# Patient Record
Sex: Female | Born: 1950 | Race: White | Hispanic: No | Marital: Married | Smoking: Never smoker
Health system: Southern US, Community
[De-identification: ages and names within clinical notes are randomized; demographics above are authoritative.]

## PROBLEM LIST (undated history)

## (undated) DIAGNOSIS — N838 Other noninflammatory disorders of ovary, fallopian tube and broad ligament: Secondary | ICD-10-CM

## (undated) DIAGNOSIS — E079 Disorder of thyroid, unspecified: Secondary | ICD-10-CM

## (undated) DIAGNOSIS — R251 Tremor, unspecified: Secondary | ICD-10-CM

## (undated) DIAGNOSIS — I10 Essential (primary) hypertension: Secondary | ICD-10-CM

## (undated) DIAGNOSIS — D219 Benign neoplasm of connective and other soft tissue, unspecified: Secondary | ICD-10-CM

## (undated) HISTORY — PX: TONSILLECTOMY AND ADENOIDECTOMY: SUR1326

## (undated) HISTORY — DX: Benign neoplasm of connective and other soft tissue, unspecified: D21.9

## (undated) HISTORY — DX: Essential (primary) hypertension: I10

## (undated) HISTORY — DX: Tremor, unspecified: R25.1

## (undated) HISTORY — DX: Other noninflammatory disorders of ovary, fallopian tube and broad ligament: N83.8

## (undated) HISTORY — DX: Disorder of thyroid, unspecified: E07.9

---

## 1983-02-06 HISTORY — PX: TUBAL LIGATION: SHX77

## 1993-02-05 DIAGNOSIS — D219 Benign neoplasm of connective and other soft tissue, unspecified: Secondary | ICD-10-CM

## 1993-02-05 HISTORY — DX: Benign neoplasm of connective and other soft tissue, unspecified: D21.9

## 1993-03-08 DIAGNOSIS — E079 Disorder of thyroid, unspecified: Secondary | ICD-10-CM

## 1993-03-08 HISTORY — DX: Disorder of thyroid, unspecified: E07.9

## 1994-05-07 DIAGNOSIS — I10 Essential (primary) hypertension: Secondary | ICD-10-CM

## 1994-05-07 HISTORY — DX: Essential (primary) hypertension: I10

## 1996-07-06 HISTORY — PX: ABDOMINAL HYSTERECTOMY: SHX81

## 1998-04-06 DIAGNOSIS — N838 Other noninflammatory disorders of ovary, fallopian tube and broad ligament: Secondary | ICD-10-CM

## 1998-04-06 HISTORY — DX: Other noninflammatory disorders of ovary, fallopian tube and broad ligament: N83.8

## 1998-05-07 HISTORY — PX: BILATERAL SALPINGOOPHORECTOMY: SHX1223

## 1998-05-10 ENCOUNTER — Inpatient Hospital Stay (HOSPITAL_COMMUNITY): Admission: RE | Admit: 1998-05-10 | Discharge: 1998-05-12 | Payer: Self-pay | Admitting: Obstetrics and Gynecology

## 1999-04-28 ENCOUNTER — Other Ambulatory Visit: Admission: RE | Admit: 1999-04-28 | Discharge: 1999-04-28 | Payer: Self-pay | Admitting: Obstetrics and Gynecology

## 1999-04-28 LAB — HM PAP SMEAR: HM Pap smear: NEGATIVE

## 2000-07-09 ENCOUNTER — Encounter: Payer: Self-pay | Admitting: Obstetrics and Gynecology

## 2000-07-09 ENCOUNTER — Ambulatory Visit (HOSPITAL_COMMUNITY): Admission: RE | Admit: 2000-07-09 | Discharge: 2000-07-09 | Payer: Self-pay | Admitting: Obstetrics and Gynecology

## 2001-09-02 ENCOUNTER — Encounter: Payer: Self-pay | Admitting: Obstetrics and Gynecology

## 2001-09-02 ENCOUNTER — Ambulatory Visit (HOSPITAL_COMMUNITY): Admission: RE | Admit: 2001-09-02 | Discharge: 2001-09-02 | Payer: Self-pay | Admitting: Obstetrics and Gynecology

## 2003-05-11 ENCOUNTER — Ambulatory Visit (HOSPITAL_COMMUNITY): Admission: RE | Admit: 2003-05-11 | Discharge: 2003-05-11 | Payer: Self-pay | Admitting: Obstetrics and Gynecology

## 2004-12-26 ENCOUNTER — Ambulatory Visit (HOSPITAL_COMMUNITY): Admission: RE | Admit: 2004-12-26 | Discharge: 2004-12-26 | Payer: Self-pay | Admitting: Obstetrics and Gynecology

## 2006-01-02 ENCOUNTER — Ambulatory Visit (HOSPITAL_COMMUNITY): Admission: RE | Admit: 2006-01-02 | Discharge: 2006-01-02 | Payer: Self-pay | Admitting: Obstetrics & Gynecology

## 2006-08-20 ENCOUNTER — Ambulatory Visit (HOSPITAL_COMMUNITY): Admission: RE | Admit: 2006-08-20 | Discharge: 2006-08-20 | Payer: Self-pay | Admitting: Obstetrics and Gynecology

## 2006-11-06 HISTORY — PX: COLONOSCOPY: SHX174

## 2007-01-08 ENCOUNTER — Ambulatory Visit (HOSPITAL_COMMUNITY): Admission: RE | Admit: 2007-01-08 | Discharge: 2007-01-08 | Payer: Self-pay | Admitting: Obstetrics & Gynecology

## 2009-02-05 LAB — HM COLONOSCOPY

## 2010-02-26 ENCOUNTER — Encounter: Payer: Self-pay | Admitting: Obstetrics and Gynecology

## 2010-02-26 ENCOUNTER — Encounter: Payer: Self-pay | Admitting: Obstetrics & Gynecology

## 2010-05-04 ENCOUNTER — Other Ambulatory Visit: Payer: Self-pay | Admitting: Obstetrics and Gynecology

## 2010-05-04 DIAGNOSIS — Z1231 Encounter for screening mammogram for malignant neoplasm of breast: Secondary | ICD-10-CM

## 2010-05-04 DIAGNOSIS — Z780 Asymptomatic menopausal state: Secondary | ICD-10-CM

## 2010-05-11 ENCOUNTER — Ambulatory Visit (HOSPITAL_COMMUNITY): Payer: BC Managed Care – PPO

## 2010-05-11 ENCOUNTER — Ambulatory Visit (HOSPITAL_COMMUNITY): Payer: Self-pay

## 2010-10-27 ENCOUNTER — Ambulatory Visit (HOSPITAL_COMMUNITY)
Admission: RE | Admit: 2010-10-27 | Discharge: 2010-10-27 | Disposition: A | Discharge status: Home / Self Care | Payer: BC Managed Care – PPO | Source: Ambulatory Visit | Attending: Obstetrics and Gynecology | Admitting: Obstetrics and Gynecology

## 2010-10-27 DIAGNOSIS — Z1231 Encounter for screening mammogram for malignant neoplasm of breast: Secondary | ICD-10-CM | POA: Insufficient documentation

## 2010-10-27 DIAGNOSIS — Z780 Asymptomatic menopausal state: Secondary | ICD-10-CM

## 2011-11-07 ENCOUNTER — Other Ambulatory Visit: Payer: Self-pay | Admitting: Nurse Practitioner

## 2011-11-07 DIAGNOSIS — Z1231 Encounter for screening mammogram for malignant neoplasm of breast: Secondary | ICD-10-CM

## 2011-11-27 ENCOUNTER — Ambulatory Visit (HOSPITAL_COMMUNITY)
Admission: RE | Admit: 2011-11-27 | Discharge: 2011-11-27 | Disposition: A | Discharge status: Home / Self Care | Payer: BC Managed Care – PPO | Source: Ambulatory Visit | Attending: Nurse Practitioner | Admitting: Nurse Practitioner

## 2011-11-27 DIAGNOSIS — Z1231 Encounter for screening mammogram for malignant neoplasm of breast: Secondary | ICD-10-CM

## 2011-11-27 LAB — HM MAMMOGRAPHY: HM MAMMO: NEGATIVE

## 2012-08-19 ENCOUNTER — Other Ambulatory Visit: Payer: Self-pay | Admitting: Nurse Practitioner

## 2012-08-19 NOTE — Telephone Encounter (Signed)
eScribe request for refill on ESTRADIOL  Last filled - 08/13/11 X 1 YEAR Last AEX - 08/13/11 Next AEX - not scheduled. PC to pt.  AEX scheduled for 08/26/12.  She has 11 days worth of medication left.  She will wait for appt to have med refilled.  RX denied with pharmacy.

## 2012-08-21 ENCOUNTER — Encounter: Payer: Self-pay | Admitting: *Deleted

## 2012-08-26 ENCOUNTER — Ambulatory Visit (INDEPENDENT_AMBULATORY_CARE_PROVIDER_SITE_OTHER): Payer: 59 | Admitting: Nurse Practitioner

## 2012-08-26 ENCOUNTER — Encounter: Payer: Self-pay | Admitting: Nurse Practitioner

## 2012-08-26 VITALS — BP 124/70 | HR 76 | Resp 12 | Ht 64.5 in | Wt 151.2 lb

## 2012-08-26 DIAGNOSIS — Z0000 Encounter for general adult medical examination without abnormal findings: Secondary | ICD-10-CM

## 2012-08-26 DIAGNOSIS — E039 Hypothyroidism, unspecified: Secondary | ICD-10-CM

## 2012-08-26 DIAGNOSIS — Z01419 Encounter for gynecological examination (general) (routine) without abnormal findings: Secondary | ICD-10-CM

## 2012-08-26 DIAGNOSIS — E559 Vitamin D deficiency, unspecified: Secondary | ICD-10-CM

## 2012-08-26 LAB — POCT URINALYSIS DIPSTICK
Leukocytes, UA: NEGATIVE
Spec Grav, UA: 1.02
pH, UA: 5.5

## 2012-08-26 MED ORDER — LEVOTHYROXINE SODIUM 25 MCG PO TABS: 25.0000 ug | ORAL_TABLET | Freq: Every day | ORAL | Status: DC

## 2012-08-26 NOTE — Progress Notes (Signed)
62 y.o. [id number] Married Caucasian Fe here for annual exam.  Doing well on Estradiol, but is OK to start tapering off this summer into the fall.  If decides that she needs is not doing well with tapering will call back.   No LMP recorded. Patient has had a hysterectomy.          Sexually active: yes  The current method of family planning is status post hysterectomy.    Exercising: yes  walking  Smoker:  no  Health Maintenance: Pap:  04/28/1999  negative MMG:  11/27/2011 normal Colonoscopy:  11/2006 normal BMD:   10/2010  T Score:  Spine 0.0/ left hip neck -0.9 / left hip total -1.0 TDaP:  08/13/2011 Labs: Hgb-14.2   reports that she has never smoked. She has never used smokeless tobacco. She reports that she drinks about 1.5 ounces of alcohol per week. She reports that she does not use illicit drugs.  Past Medical History  Diagnosis Date  . Fibroid   . Tremors of nervous system   . Thyroid disease     hyperthyroidism  . Hypertension   . Ovarian mass     right ovarian mass    Past Surgical History  Procedure Laterality Date  . Abdominal hysterectomy  07/1996  . Bilateral salpingoophorectomy  05/1998    parasitic fibroid  . Tubal ligation Bilateral   . Vaginal delivery      Current Outpatient Prescriptions  Medication Sig Dispense Refill  . atenolol-chlorthalidone (TENORETIC) 50-25 MG per tablet Take 1 tablet by mouth daily.      . calcium carbonate 1250 MG capsule Take 1,250 mg by mouth 2 (two) times daily with a meal.      . estradiol (VIVELLE-DOT) 0.05 MG/24HR Place 1 patch onto the skin once a week.      . irbesartan (AVAPRO) 150 MG tablet Take 150 mg by mouth at bedtime.      Marland Kitchen levothyroxine (SYNTHROID, LEVOTHROID) 25 MCG tablet Take 25 mcg by mouth daily before breakfast.      . Multiple Vitamin (MULTIVITAMIN) tablet Take 1 tablet by mouth daily.       No current facility-administered medications for this visit.    History reviewed. No pertinent family history.  ROS:   Pertinent items are noted in HPI.  Otherwise, a comprehensive ROS was negative.  Exam:   BP 124/70  Pulse 76  Resp 12  Ht 5' 4.5" (1.638 m)  Wt 151 lb 3.2 oz (68.584 kg)  BMI 25.56 kg/m2 Height: 5' 4.5" (163.8 cm)  Ht Readings from Last 3 Encounters:  08/26/12 5' 4.5" (1.638 m)    General appearance: alert, cooperative and appears stated age Head: Normocephalic, without obvious abnormality, atraumatic Neck: no adenopathy, supple, symmetrical, trachea midline and thyroid normal to inspection and palpation Lungs: clear to auscultation bilaterally Breasts: normal appearance, no masses or tenderness Heart: regular rate and rhythm Abdomen: soft, non-tender; no masses,  no organomegaly Extremities: extremities normal, atraumatic, no cyanosis or edema Skin: Skin color, texture, turgor normal. No rashes or lesions Lymph nodes: Cervical, supraclavicular, and axillary nodes normal. No abnormal inguinal nodes palpated Neurologic: Grossly normal   Pelvic: External genitalia:  no lesions              Urethra:  normal appearing urethra with no masses, tenderness or lesions              Bartholin's and Skene's: normal  Vagina: normal appearing vagina with normal color and discharge, no lesions              Cervix: absent              Pap taken: no Bimanual Exam:  Uterus:  uterus absent              Adnexa: no mass, fullness, tenderness               Rectovaginal: Confirms               Anus:  normal sphincter tone, no lesions  A:  Well Woman with normal exam  S/P TVH secondary to fibroids and adenomyosis 6/98  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  She is comfortable with plan.  counseled on breast self exam, use and side effects of HRT, adequate intake of  calcium and vitamin D, diet and exercise return annually  or prn  An After Visit Summary was printed and given to the patient.

## 2012-08-26 NOTE — Patient Instructions (Signed)

## 2012-08-27 LAB — VITAMIN D 25 HYDROXY (VIT D DEFICIENCY, FRACTURES): Vit D, 25-Hydroxy: 118 ng/mL — ABNORMAL HIGH (ref 30–89)

## 2012-08-27 NOTE — Progress Notes (Signed)
Encounter reviewed by Dr. Brook Silva.  

## 2012-08-28 ENCOUNTER — Other Ambulatory Visit: Payer: Self-pay | Admitting: Nurse Practitioner

## 2012-08-28 DIAGNOSIS — E559 Vitamin D deficiency, unspecified: Secondary | ICD-10-CM

## 2012-09-01 ENCOUNTER — Telehealth: Payer: Self-pay | Admitting: *Deleted

## 2012-09-01 NOTE — Telephone Encounter (Signed)
Pt is aware of lab results and has a 3 month vitamin D recheck appt/lab appt on 12/15/2012 at 11:30. Pt will stay off of Vitamin D for the next 3 months.

## 2012-09-01 NOTE — Telephone Encounter (Signed)
Message copied by Osie Bond on Mon Sep 01, 2012  2:08 PM ------      Message from: Ria Comment R      Created: Thu Aug 28, 2012  8:27 AM       let patient know her lab results - her Vit D is way high , have her to go off all OTC Vit D and recheck in 3 months to make sure it is coming down. Order is placed. TSH is normal andf continue same dose. ------

## 2012-09-25 ENCOUNTER — Encounter: Payer: Self-pay | Admitting: Family Medicine

## 2012-09-25 ENCOUNTER — Ambulatory Visit (INDEPENDENT_AMBULATORY_CARE_PROVIDER_SITE_OTHER): Payer: 59 | Admitting: Family Medicine

## 2012-09-25 VITALS — BP 122/84 | HR 58 | Temp 98.6°F | Ht 64.5 in | Wt 148.8 lb

## 2012-09-25 DIAGNOSIS — Z1331 Encounter for screening for depression: Secondary | ICD-10-CM

## 2012-09-25 DIAGNOSIS — Z0000 Encounter for general adult medical examination without abnormal findings: Secondary | ICD-10-CM | POA: Insufficient documentation

## 2012-09-25 LAB — CBC WITH DIFFERENTIAL/PLATELET
Basophils Relative: 0.4 % (ref 0.0–3.0)
Eosinophils Relative: 0.3 % (ref 0.0–5.0)
HCT: 42.6 % (ref 36.0–46.0)
Lymphs Abs: 2.1 10*3/uL (ref 0.7–4.0)
Monocytes Relative: 7.5 % (ref 3.0–12.0)
Neutrophils Relative %: 64.4 % (ref 43.0–77.0)
Platelets: 235 10*3/uL (ref 150.0–400.0)
RBC: 4.57 Mil/uL (ref 3.87–5.11)
WBC: 7.5 10*3/uL (ref 4.5–10.5)

## 2012-09-25 LAB — BASIC METABOLIC PANEL
CO2: 31 mEq/L (ref 19–32)
Glucose, Bld: 87 mg/dL (ref 70–99)
Potassium: 3.8 mEq/L (ref 3.5–5.1)
Sodium: 139 mEq/L (ref 135–145)

## 2012-09-25 LAB — HEPATIC FUNCTION PANEL
AST: 25 U/L (ref 0–37)
Albumin: 4.1 g/dL (ref 3.5–5.2)
Alkaline Phosphatase: 57 U/L (ref 39–117)
Total Protein: 7.5 g/dL (ref 6.0–8.3)

## 2012-09-25 LAB — LIPID PANEL
LDL Cholesterol: 111 mg/dL — ABNORMAL HIGH (ref 0–99)
VLDL: 33.6 mg/dL (ref 0.0–40.0)

## 2012-09-25 NOTE — Patient Instructions (Addendum)
Follow up in 6 months to recheck BP and thyroid We'll notify you of your lab results and make any changes if needed Keep up the good work!  You look great! Call with any questions or concerns Welcome!  We're glad to have you!

## 2012-09-25 NOTE — Assessment & Plan Note (Signed)
Pt's PE WNL.  UTD on health maintenance.  Check labs.  EKG done- see document for interpretation.  Anticipatory guidance provided.  

## 2012-09-25 NOTE — Progress Notes (Signed)
  Subjective:    Patient ID: Mary Cohen, female    DOB: [birth year], 63 y.o.   MRN: [id number]  HPI New to establish.  Previous MD- Drucie Opitz, [facility]  GYN- Judie Bonus  Health Maintenance- UTD on DEXA (7/14), colonoscopy 2011, [facility]  No concerns today   Review of Systems Patient reports no vision/ hearing changes, adenopathy,fever, weight change,  persistant/recurrent hoarseness , swallowing issues, chest pain, palpitations, edema, persistant/recurrent cough, hemoptysis, dyspnea (rest/exertional/paroxysmal nocturnal), gastrointestinal bleeding (melena, rectal bleeding), abdominal pain, significant heartburn, bowel changes, GU symptoms (dysuria, hematuria, incontinence), Gyn symptoms (abnormal  bleeding, pain),  syncope, focal weakness, memory loss, numbness & tingling, skin/hair/nail changes, abnormal bruising or bleeding, anxiety, or depression.     Objective:   Physical Exam General Appearance:    Alert, cooperative, no distress, appears stated age  Head:    Normocephalic, without obvious abnormality, atraumatic  Eyes:    PERRL, conjunctiva/corneas clear, EOM's intact, fundi    benign, both eyes  Ears:    Normal TM's and external ear canals, both ears  Nose:   Nares normal, septum midline, mucosa normal, no drainage    or sinus tenderness  Throat:   Lips, mucosa, and tongue normal; teeth and gums normal  Neck:   Supple, symmetrical, trachea midline, no adenopathy;    Thyroid: no enlargement/tenderness/nodules  Back:     Symmetric, no curvature, ROM normal, no CVA tenderness  Lungs:     Clear to auscultation bilaterally, respirations unlabored  Chest Wall:    No tenderness or deformity   Heart:    Regular rate and rhythm, S1 and S2 normal, no murmur, rub   or gallop  Breast Exam:    Deferred to GYN  Abdomen:     Soft, non-tender, bowel sounds active all four quadrants,    no masses, no organomegaly  Genitalia:    Deferred to GYN  Rectal:    Extremities:    Extremities normal, atraumatic, no cyanosis or edema  Pulses:   2+ and symmetric all extremities  Skin:   Skin color, texture, turgor normal, no rashes or lesions  Lymph nodes:   Cervical, supraclavicular, and axillary nodes normal  Neurologic:   CNII-XII intact, normal strength, sensation and reflexes    throughout          Assessment & Plan:

## 2012-10-01 ENCOUNTER — Encounter: Payer: Self-pay | Admitting: *Deleted

## 2012-10-01 LAB — VITAMIN D 1,25 DIHYDROXY
Vitamin D 1, 25 (OH)2 Total: 66 pg/mL (ref 18–72)
Vitamin D3 1, 25 (OH)2: 66 pg/mL

## 2012-10-02 ENCOUNTER — Telehealth: Payer: Self-pay | Admitting: General Practice

## 2012-10-02 NOTE — Telephone Encounter (Signed)
Message on triage line: pt left only name, DOB, and phone number. Called back and LMOVM for pt to return call to advise how I could help.

## 2012-10-03 NOTE — Telephone Encounter (Signed)
Pt called again today. LMOVM to inform our office if there is any thing we can do to help her. Closing encounter until pt calls.

## 2012-11-11 ENCOUNTER — Telehealth: Payer: Self-pay | Admitting: Nurse Practitioner

## 2012-11-11 NOTE — Telephone Encounter (Signed)
Patient is calling to cancel to her appt for 11/10 because she said she had her vitamin D checked on august 23 at her primary care and they were supposed to send the results over to [place]. She also wanted to know when her last tdap shot was.

## 2012-11-11 NOTE — Telephone Encounter (Signed)
Call to pt. Line busy.   (Last TDaP was 08-13-11. Results in Epic for Vit D result at PCP. Level was 66.)

## 2012-11-12 NOTE — Telephone Encounter (Signed)
Patient is returning Tracy's call.

## 2012-11-12 NOTE — Telephone Encounter (Signed)
Since her PCP did Vit D and it was this good at 28 she does not need RX Vit D for now and can go to OTC Vit D at 1000 IU daily.  Tell her Hi!

## 2012-11-12 NOTE — Telephone Encounter (Signed)
Message left to return call to [place] at [phone].   To give message below from Lauro Franklin, FNP

## 2012-11-12 NOTE — Telephone Encounter (Signed)
Spoke with patient and message from Lauro Franklin, FNP Given. Verbalized understanding.

## 2012-11-12 NOTE — Telephone Encounter (Signed)
Mary Cohen,  Spoke with Patient. She had Vitamin D done at pcp in August. She wanted to know if she needs another lab draw here and if she should continue with Vitamin D 50,000 capsules.

## 2012-11-25 ENCOUNTER — Other Ambulatory Visit: Payer: Self-pay | Admitting: General Practice

## 2012-11-25 MED ORDER — IRBESARTAN 150 MG PO TABS: 150.0000 mg | ORAL_TABLET | Freq: Every day | ORAL | Status: DC

## 2012-12-15 ENCOUNTER — Other Ambulatory Visit: Payer: Self-pay

## 2012-12-16 ENCOUNTER — Other Ambulatory Visit: Payer: Self-pay | Admitting: General Practice

## 2012-12-16 MED ORDER — IRBESARTAN 150 MG PO TABS: 150.0000 mg | ORAL_TABLET | Freq: Every day | ORAL | Status: DC

## 2012-12-17 ENCOUNTER — Other Ambulatory Visit: Payer: Self-pay | Admitting: Nurse Practitioner

## 2012-12-17 DIAGNOSIS — Z1231 Encounter for screening mammogram for malignant neoplasm of breast: Secondary | ICD-10-CM

## 2013-01-06 ENCOUNTER — Ambulatory Visit (HOSPITAL_COMMUNITY)
Admission: RE | Admit: 2013-01-06 | Discharge: 2013-01-06 | Disposition: A | Discharge status: Home / Self Care | Payer: 59 | Source: Ambulatory Visit | Attending: Nurse Practitioner | Admitting: Nurse Practitioner

## 2013-01-06 DIAGNOSIS — Z1231 Encounter for screening mammogram for malignant neoplasm of breast: Secondary | ICD-10-CM | POA: Insufficient documentation

## 2013-02-05 LAB — HM PAP SMEAR

## 2013-04-14 ENCOUNTER — Encounter: Payer: Self-pay | Admitting: General Practice

## 2013-08-05 ENCOUNTER — Other Ambulatory Visit: Payer: Self-pay | Admitting: General Practice

## 2013-08-05 MED ORDER — ATENOLOL-CHLORTHALIDONE 50-25 MG PO TABS: 1.0000 | ORAL_TABLET | Freq: Every day | ORAL | Status: DC

## 2013-10-14 ENCOUNTER — Encounter: Payer: Self-pay | Admitting: Family Medicine

## 2013-10-14 ENCOUNTER — Ambulatory Visit (INDEPENDENT_AMBULATORY_CARE_PROVIDER_SITE_OTHER): Payer: BC Managed Care – PPO | Admitting: Family Medicine

## 2013-10-14 VITALS — BP 124/84 | HR 65 | Temp 98.0°F | Resp 16 | Ht 64.5 in | Wt 145.2 lb

## 2013-10-14 DIAGNOSIS — I10 Essential (primary) hypertension: Secondary | ICD-10-CM | POA: Insufficient documentation

## 2013-10-14 DIAGNOSIS — Z0000 Encounter for general adult medical examination without abnormal findings: Secondary | ICD-10-CM

## 2013-10-14 LAB — CBC WITH DIFFERENTIAL/PLATELET
Basophils Absolute: 0 10*3/uL (ref 0.0–0.1)
Basophils Relative: 0.1 % (ref 0.0–3.0)
EOS PCT: 0.2 % (ref 0.0–5.0)
Eosinophils Absolute: 0 10*3/uL (ref 0.0–0.7)
HCT: 43.3 % (ref 36.0–46.0)
Hemoglobin: 14.7 g/dL (ref 12.0–15.0)
Lymphocytes Relative: 23.4 % (ref 12.0–46.0)
Lymphs Abs: 2 10*3/uL (ref 0.7–4.0)
MCHC: 33.9 g/dL (ref 30.0–36.0)
MCV: 93.5 fl (ref 78.0–100.0)
MONOS PCT: 7.3 % (ref 3.0–12.0)
Monocytes Absolute: 0.6 10*3/uL (ref 0.1–1.0)
NEUTROS ABS: 5.8 10*3/uL (ref 1.4–7.7)
Neutrophils Relative %: 69 % (ref 43.0–77.0)
PLATELETS: 244 10*3/uL (ref 150.0–400.0)
RBC: 4.63 Mil/uL (ref 3.87–5.11)
RDW: 13.1 % (ref 11.5–15.5)
WBC: 8.4 10*3/uL (ref 4.0–10.5)

## 2013-10-14 LAB — LIPID PANEL
CHOL/HDL RATIO: 4
Cholesterol: 202 mg/dL — ABNORMAL HIGH (ref 0–200)
HDL: 52.3 mg/dL (ref >39.00)
LDL CALC: 117 mg/dL — AB (ref 0–99)
NonHDL: 149.7
TRIGLYCERIDES: 163 mg/dL — AB (ref 0.0–149.0)
VLDL: 32.6 mg/dL (ref 0.0–40.0)

## 2013-10-14 LAB — BASIC METABOLIC PANEL
BUN: 10 mg/dL (ref 6–23)
CALCIUM: 10.2 mg/dL (ref 8.4–10.5)
CO2: 33 mEq/L — ABNORMAL HIGH (ref 19–32)
Chloride: 100 mEq/L (ref 96–112)
Creatinine, Ser: 0.8 mg/dL (ref 0.4–1.2)
GFR: 74.81 mL/min (ref >60.00)
Glucose, Bld: 97 mg/dL (ref 70–99)
Potassium: 3.6 mEq/L (ref 3.5–5.1)
Sodium: 140 mEq/L (ref 135–145)

## 2013-10-14 LAB — HEPATIC FUNCTION PANEL
ALBUMIN: 4.4 g/dL (ref 3.5–5.2)
ALT: 19 U/L (ref 0–35)
AST: 25 U/L (ref 0–37)
Alkaline Phosphatase: 65 U/L (ref 39–117)
Bilirubin, Direct: 0.1 mg/dL (ref 0.0–0.3)
Total Bilirubin: 0.7 mg/dL (ref 0.2–1.2)
Total Protein: 7.9 g/dL (ref 6.0–8.3)

## 2013-10-14 LAB — TSH: TSH: 0.8 u[IU]/mL (ref 0.35–4.50)

## 2013-10-14 LAB — VITAMIN D 25 HYDROXY (VIT D DEFICIENCY, FRACTURES): VITD: 59.89 ng/mL (ref 30.00–100.00)

## 2013-10-14 NOTE — Progress Notes (Signed)
   Subjective:    Patient ID: Mary Cohen, female    DOB: [birth year], 63 y.o.   MRN: [id number]  HPI CPE- UTD on colonoscopy, mammo, DEXA.  No need for paps.     Review of Systems Patient reports no vision/ hearing changes, adenopathy,fever, weight change,  persistant/recurrent hoarseness , swallowing issues, chest pain, palpitations, edema, persistant/recurrent cough, hemoptysis, dyspnea (rest/exertional/paroxysmal nocturnal), gastrointestinal bleeding (melena, rectal bleeding), abdominal pain, significant heartburn, bowel changes, GU symptoms (dysuria, hematuria, incontinence), Gyn symptoms (abnormal  bleeding, pain),  syncope, focal weakness, memory loss, numbness & tingling, skin/hair/nail changes, abnormal bruising or bleeding, anxiety, or depression.     Objective:   Physical Exam General Appearance:    Alert, cooperative, no distress, appears stated age  Head:    Normocephalic, without obvious abnormality, atraumatic  Eyes:    PERRL, conjunctiva/corneas clear, EOM's intact, fundi    benign, both eyes  Ears:    Normal TM's and external ear canals, both ears  Nose:   Nares normal, septum midline, mucosa normal, no drainage    or sinus tenderness  Throat:   Lips, mucosa, and tongue normal; teeth and gums normal  Neck:   Supple, symmetrical, trachea midline, no adenopathy;    Thyroid: no enlargement/tenderness/nodules  Back:     Symmetric, no curvature, ROM normal, no CVA tenderness  Lungs:     Clear to auscultation bilaterally, respirations unlabored  Chest Wall:    No tenderness or deformity   Heart:    Regular rate and rhythm, S1 and S2 normal, no murmur, rub   or gallop  Breast Exam:    Deferred to mammo  Abdomen:     Soft, non-tender, bowel sounds active all four quadrants,    no masses, no organomegaly  Genitalia:    Deferred  Rectal:    Extremities:   Extremities normal, atraumatic, no cyanosis or edema  Pulses:   2+ and symmetric all extremities  Skin:   Skin  color, texture, turgor normal, no rashes or lesions  Lymph nodes:   Cervical, supraclavicular, and axillary nodes normal  Neurologic:   CNII-XII intact, normal strength, sensation and reflexes    throughout          Assessment & Plan:

## 2013-10-14 NOTE — Progress Notes (Signed)
Pre visit review using our clinic review tool, if applicable. No additional management support is needed unless otherwise documented below in the visit note. 

## 2013-10-14 NOTE — Assessment & Plan Note (Signed)
Pt's PE WNL.  UTD on health maintenance.  Check labs.  Anticipatory guidance provided.  

## 2013-10-14 NOTE — Assessment & Plan Note (Signed)
Chronic problem.  Pt is asking to wean off 1 med if possible.  Since pt is only taking 1/4 tab of Avapro, this is the most logical to stop at this time.  Pt to monitor home BPs and call if BPs are consistently higher than 140/90.  Pt expressed understanding and is in agreement w/ plan.

## 2013-10-14 NOTE — Patient Instructions (Signed)
Follow up in 4-6 weeks to recheck BP STOP the Irbesartan (avapro) Monitor your BP at home.  If consistently higher than 140/90, call me We'll notify you of your lab results and make any changes if needed Call with any questions or concerns Happy Fall!

## 2013-10-15 ENCOUNTER — Encounter: Payer: Self-pay | Admitting: General Practice

## 2013-10-15 ENCOUNTER — Telehealth: Payer: Self-pay | Admitting: Family Medicine

## 2013-10-15 DIAGNOSIS — E039 Hypothyroidism, unspecified: Secondary | ICD-10-CM

## 2013-10-15 MED ORDER — LEVOTHYROXINE SODIUM 25 MCG PO TABS: 25.0000 ug | ORAL_TABLET | Freq: Every day | ORAL | Status: DC

## 2013-10-15 NOTE — Telephone Encounter (Signed)
levothroxin 93mcg take 1 per day to [facility] [address]

## 2013-10-15 NOTE — Telephone Encounter (Signed)
Med filled.  

## 2013-11-11 ENCOUNTER — Ambulatory Visit: Payer: BC Managed Care – PPO | Admitting: Family Medicine

## 2013-12-07 ENCOUNTER — Encounter: Payer: Self-pay | Admitting: Family Medicine

## 2014-01-08 ENCOUNTER — Telehealth: Payer: Self-pay

## 2014-01-08 DIAGNOSIS — R7989 Other specified abnormal findings of blood chemistry: Secondary | ICD-10-CM

## 2014-01-08 MED ORDER — ATENOLOL-CHLORTHALIDONE 50-25 MG PO TABS: 1.0000 | ORAL_TABLET | Freq: Every day | ORAL | Status: DC

## 2014-01-08 MED ORDER — LEVOTHYROXINE SODIUM 25 MCG PO TABS: 25.0000 ug | ORAL_TABLET | Freq: Every day | ORAL | Status: DC

## 2014-01-08 MED ORDER — LEVOTHYROXINE SODIUM 25 MCG PO TABS
25.0000 ug | ORAL_TABLET | Freq: Every day | ORAL | Status: DC
Start: 2014-01-08 — End: 2015-03-03

## 2014-01-08 NOTE — Telephone Encounter (Addendum)
Mary Cohen [phone] [place] needs a refill for her levothyroxine (SYNTHROID, LEVOTHROID) 25 MCG tablet / atenolol-chlorthalidone (TENORETIC) 50-25 MG per tablet To last her for the next year, she did get this when she was in for her CPE

## 2014-01-08 NOTE — Telephone Encounter (Signed)
Meds filled

## 2015-03-01 ENCOUNTER — Telehealth: Payer: Self-pay | Admitting: Behavioral Health

## 2015-03-01 ENCOUNTER — Encounter: Payer: Self-pay | Admitting: Behavioral Health

## 2015-03-01 NOTE — Telephone Encounter (Signed)
Pre-Visit Call completed with patient and chart updated.   Pre-Visit Info documented in Specialty Comments under [employer].    

## 2015-03-02 ENCOUNTER — Encounter: Payer: Self-pay | Admitting: Family Medicine

## 2015-03-02 ENCOUNTER — Ambulatory Visit (INDEPENDENT_AMBULATORY_CARE_PROVIDER_SITE_OTHER): Payer: BLUE CROSS/BLUE SHIELD | Admitting: Family Medicine

## 2015-03-02 VITALS — BP 128/76 | HR 52 | Temp 98.2°F | Ht 65.0 in | Wt 137.4 lb

## 2015-03-02 DIAGNOSIS — Z780 Asymptomatic menopausal state: Secondary | ICD-10-CM

## 2015-03-02 DIAGNOSIS — Z1231 Encounter for screening mammogram for malignant neoplasm of breast: Secondary | ICD-10-CM | POA: Diagnosis not present

## 2015-03-02 DIAGNOSIS — Z0000 Encounter for general adult medical examination without abnormal findings: Secondary | ICD-10-CM | POA: Diagnosis not present

## 2015-03-02 LAB — BASIC METABOLIC PANEL
BUN: 12 mg/dL (ref 6–23)
CO2: 34 meq/L — AB (ref 19–32)
Calcium: 10 mg/dL (ref 8.4–10.5)
Chloride: 100 mEq/L (ref 96–112)
Creatinine, Ser: 0.78 mg/dL (ref 0.40–1.20)
GFR: 78.91 mL/min (ref >60.00)
Glucose, Bld: 102 mg/dL — ABNORMAL HIGH (ref 70–99)
POTASSIUM: 4.2 meq/L (ref 3.5–5.1)
Sodium: 141 mEq/L (ref 135–145)

## 2015-03-02 LAB — LIPID PANEL
CHOLESTEROL: 180 mg/dL (ref 0–200)
HDL: 44.9 mg/dL (ref >39.00)
LDL Cholesterol: 102 mg/dL — ABNORMAL HIGH (ref 0–99)
NonHDL: 134.73
TRIGLYCERIDES: 165 mg/dL — AB (ref 0.0–149.0)
Total CHOL/HDL Ratio: 4
VLDL: 33 mg/dL (ref 0.0–40.0)

## 2015-03-02 LAB — CBC WITH DIFFERENTIAL/PLATELET
Basophils Absolute: 0 10*3/uL (ref 0.0–0.1)
Basophils Relative: 0.3 % (ref 0.0–3.0)
EOS ABS: 0.1 10*3/uL (ref 0.0–0.7)
EOS PCT: 0.7 % (ref 0.0–5.0)
HCT: 44.6 % (ref 36.0–46.0)
Hemoglobin: 14.8 g/dL (ref 12.0–15.0)
LYMPHS ABS: 2.3 10*3/uL (ref 0.7–4.0)
Lymphocytes Relative: 23.2 % (ref 12.0–46.0)
MCHC: 33.3 g/dL (ref 30.0–36.0)
MCV: 93.2 fl (ref 78.0–100.0)
MONO ABS: 0.7 10*3/uL (ref 0.1–1.0)
Monocytes Relative: 6.7 % (ref 3.0–12.0)
NEUTROS PCT: 69.1 % (ref 43.0–77.0)
Neutro Abs: 6.9 10*3/uL (ref 1.4–7.7)
Platelets: 279 10*3/uL (ref 150.0–400.0)
RBC: 4.79 Mil/uL (ref 3.87–5.11)
RDW: 13.6 % (ref 11.5–15.5)
WBC: 10.1 10*3/uL (ref 4.0–10.5)

## 2015-03-02 LAB — HEPATIC FUNCTION PANEL
ALBUMIN: 4.2 g/dL (ref 3.5–5.2)
ALK PHOS: 59 U/L (ref 39–117)
ALT: 27 U/L (ref 0–35)
AST: 25 U/L (ref 0–37)
Bilirubin, Direct: 0.1 mg/dL (ref 0.0–0.3)
Total Bilirubin: 0.7 mg/dL (ref 0.2–1.2)
Total Protein: 7.3 g/dL (ref 6.0–8.3)

## 2015-03-02 LAB — TSH: TSH: 1.12 u[IU]/mL (ref 0.35–4.50)

## 2015-03-02 LAB — VITAMIN D 25 HYDROXY (VIT D DEFICIENCY, FRACTURES): VITD: 36.63 ng/mL (ref 30.00–100.00)

## 2015-03-02 NOTE — Patient Instructions (Signed)
Follow up in 1 year or as needed We'll notify you of your lab results and make any changes if needed Keep up the good work on healthy diet and regular exercise!  You look great!! We'll call you with your mammogram and bone density appt (or you can stop downstairs and schedule on your way out) Call with any questions or concerns If you want to join [place] at the new [place] office, any scheduled appointments will automatically transfer and we will see you at [address] [place] [address] [place], [place] [address] ([place]) [place]!!!

## 2015-03-02 NOTE — Assessment & Plan Note (Signed)
Pt's PE WNL.  UTD on pap- due for mammo and DEXA (order entered.  UTD on colonoscopy.  UTD on immunizations.  Check labs.  Anticipatory guidance provided.

## 2015-03-02 NOTE — Progress Notes (Signed)
Pre visit review using our clinic review tool, if applicable. No additional management support is needed unless otherwise documented below in the visit note. 

## 2015-03-02 NOTE — Progress Notes (Signed)
   Subjective:    Patient ID: Mary Cohen, female    DOB: [birth year], 65 y.o.   MRN: [id number]  HPI CPE- UTD on pap w/ [address], due for DEXA and mammo- pt prefers downstairs.  Pt had colonoscopy 2011 and was told to repeat in 2021 (done in [place], Dr Tamala Julian).  UTD on flu shot (done Oct 2016)  UTD on Tdap 2015   Review of Systems Patient reports no vision/ hearing changes, adenopathy,fever, weight change,  persistant/recurrent hoarseness , swallowing issues, chest pain, palpitations, edema, persistant/recurrent cough, hemoptysis, dyspnea (rest/exertional/paroxysmal nocturnal), gastrointestinal bleeding (melena, rectal bleeding), abdominal pain, significant heartburn, bowel changes, GU symptoms (dysuria, hematuria, incontinence), Gyn symptoms (abnormal  bleeding, pain),  syncope, focal weakness, memory loss, numbness & tingling, skin/hair/nail changes, abnormal bruising or bleeding, anxiety, or depression.     Objective:   Physical Exam General Appearance:    Alert, cooperative, no distress, appears stated age  Head:    Normocephalic, without obvious abnormality, atraumatic  Eyes:    PERRL, conjunctiva/corneas clear, EOM's intact, fundi    benign, both eyes  Ears:    Normal TM's and external ear canals, both ears  Nose:   Nares normal, septum midline, mucosa normal, no drainage    or sinus tenderness  Throat:   Lips, mucosa, and tongue normal; teeth and gums normal  Neck:   Supple, symmetrical, trachea midline, no adenopathy;    Thyroid: no enlargement/tenderness/nodules  Back:     Symmetric, no curvature, ROM normal, no CVA tenderness  Lungs:     Clear to auscultation bilaterally, respirations unlabored  Chest Wall:    No tenderness or deformity   Heart:    Regular rate and rhythm, S1 and S2 normal, no murmur, rub   or gallop  Breast Exam:    Deferred to GYN  Abdomen:     Soft, non-tender, bowel sounds active all four quadrants,    no masses, no organomegaly  Genitalia:     Deferred to GYN  Rectal:    Extremities:   Extremities normal, atraumatic, no cyanosis or edema  Pulses:   2+ and symmetric all extremities  Skin:   Skin color, texture, turgor normal, no rashes or lesions  Lymph nodes:   Cervical, supraclavicular, and axillary nodes normal  Neurologic:   CNII-XII intact, normal strength, sensation and reflexes    throughout          Assessment & Plan:

## 2015-03-03 ENCOUNTER — Other Ambulatory Visit: Payer: Self-pay | Admitting: Family Medicine

## 2015-03-03 ENCOUNTER — Encounter: Payer: Self-pay | Admitting: General Practice

## 2015-03-03 NOTE — Telephone Encounter (Signed)
Called pt, informed that Rx is ready for pick up at pharmacy. She is grateful.   Thanks.

## 2015-03-03 NOTE — Telephone Encounter (Signed)
Caller name: Madelinn  Relationship to patient: Self  Can be reached: [phone]  Pharmacy: [facility], [place]  Reason for call: Pt is requesting a refill on her atenolol Rx. Pt says that she only have one pill left

## 2015-03-08 ENCOUNTER — Ambulatory Visit (HOSPITAL_BASED_OUTPATIENT_CLINIC_OR_DEPARTMENT_OTHER)
Admission: RE | Admit: 2015-03-08 | Discharge: 2015-03-08 | Disposition: A | Discharge status: Home / Self Care | Payer: BLUE CROSS/BLUE SHIELD | Source: Ambulatory Visit | Attending: Family Medicine | Admitting: Family Medicine

## 2015-03-08 DIAGNOSIS — Z1231 Encounter for screening mammogram for malignant neoplasm of breast: Secondary | ICD-10-CM | POA: Insufficient documentation

## 2015-03-08 DIAGNOSIS — Z90710 Acquired absence of both cervix and uterus: Secondary | ICD-10-CM | POA: Insufficient documentation

## 2015-03-08 DIAGNOSIS — Z780 Asymptomatic menopausal state: Secondary | ICD-10-CM | POA: Insufficient documentation

## 2015-03-08 DIAGNOSIS — M8580 Other specified disorders of bone density and structure, unspecified site: Secondary | ICD-10-CM | POA: Insufficient documentation

## 2015-03-08 DIAGNOSIS — Z13820 Encounter for screening for osteoporosis: Secondary | ICD-10-CM | POA: Diagnosis present

## 2015-03-09 ENCOUNTER — Encounter: Payer: Self-pay | Admitting: General Practice

## 2015-05-30 ENCOUNTER — Other Ambulatory Visit: Payer: Self-pay | Admitting: Family Medicine

## 2015-05-30 NOTE — Telephone Encounter (Signed)
Medication filled to pharmacy as requested.   

## 2015-06-02 ENCOUNTER — Telehealth: Payer: Self-pay | Admitting: Family Medicine

## 2015-06-02 MED ORDER — LEVOTHYROXINE SODIUM 25 MCG PO TABS: ORAL_TABLET | ORAL | Status: DC

## 2015-06-02 NOTE — Telephone Encounter (Signed)
Pt needs refill on levothyroxine, [facility] [address]

## 2015-06-02 NOTE — Telephone Encounter (Signed)
Medication filled to pharmacy as requested.   

## 2015-08-10 ENCOUNTER — Telehealth: Payer: Self-pay | Admitting: Family Medicine

## 2015-08-10 MED ORDER — LEVOTHYROXINE SODIUM 25 MCG PO TABS: ORAL_TABLET | ORAL | Status: DC

## 2015-08-10 MED ORDER — ATENOLOL-CHLORTHALIDONE 50-25 MG PO TABS: 1.0000 | ORAL_TABLET | Freq: Every day | ORAL | Status: DC

## 2015-08-10 NOTE — Telephone Encounter (Signed)
Pt husband states that they have [facility] their [facility] and needs refill sent in for a 90 day supply on all meds.

## 2015-08-10 NOTE — Telephone Encounter (Signed)
[facility] fax # [phone]

## 2015-08-10 NOTE — Telephone Encounter (Signed)
Medication filled to pharmacy as requested.   

## 2015-10-18 ENCOUNTER — Ambulatory Visit (INDEPENDENT_AMBULATORY_CARE_PROVIDER_SITE_OTHER): Payer: PPO

## 2015-10-18 DIAGNOSIS — Z23 Encounter for immunization: Secondary | ICD-10-CM

## 2016-02-07 ENCOUNTER — Telehealth: Payer: Self-pay | Admitting: Family Medicine

## 2016-02-07 NOTE — Telephone Encounter (Signed)
Pt needs refill on atenolol-chlorthalidone & levothyroxine, pt states that they are chang

## 2016-02-07 NOTE — Telephone Encounter (Signed)
Pt states that they are changing Pharmacies to [facility].

## 2016-02-08 MED ORDER — ATENOLOL-CHLORTHALIDONE 50-25 MG PO TABS: 1.0000 | ORAL_TABLET | Freq: Every day | ORAL | 0 refills | Status: DC

## 2016-02-08 NOTE — Addendum Note (Signed)
Addended by: Davis Gourd on: 02/08/2016 08:58 AM   Modules accepted: Orders

## 2016-02-08 NOTE — Telephone Encounter (Signed)
Med filled and pharmacy updated.

## 2016-03-14 ENCOUNTER — Ambulatory Visit: Payer: PPO | Admitting: Family Medicine

## 2016-03-23 ENCOUNTER — Other Ambulatory Visit: Payer: Self-pay | Admitting: General Practice

## 2016-03-23 MED ORDER — LEVOTHYROXINE SODIUM 25 MCG PO TABS: ORAL_TABLET | ORAL | 0 refills | Status: DC

## 2016-04-12 ENCOUNTER — Encounter: Payer: Self-pay | Admitting: Family Medicine

## 2016-04-12 ENCOUNTER — Ambulatory Visit (INDEPENDENT_AMBULATORY_CARE_PROVIDER_SITE_OTHER): Payer: Medicare HMO | Admitting: Family Medicine

## 2016-04-12 VITALS — BP 130/80 | HR 50 | Temp 98.1°F | Resp 16 | Ht 65.0 in | Wt 153.5 lb

## 2016-04-12 DIAGNOSIS — Z0000 Encounter for general adult medical examination without abnormal findings: Secondary | ICD-10-CM

## 2016-04-12 DIAGNOSIS — Z23 Encounter for immunization: Secondary | ICD-10-CM

## 2016-04-12 DIAGNOSIS — I10 Essential (primary) hypertension: Secondary | ICD-10-CM

## 2016-04-12 DIAGNOSIS — Z1159 Encounter for screening for other viral diseases: Secondary | ICD-10-CM

## 2016-04-12 DIAGNOSIS — E039 Hypothyroidism, unspecified: Secondary | ICD-10-CM | POA: Diagnosis not present

## 2016-04-12 LAB — LIPID PANEL
Cholesterol: 235 mg/dL — ABNORMAL HIGH (ref 0–200)
HDL: 67.4 mg/dL (ref >39.00)
LDL Cholesterol: 138 mg/dL — ABNORMAL HIGH (ref 0–99)
NONHDL: 167.94
TRIGLYCERIDES: 152 mg/dL — AB (ref 0.0–149.0)
Total CHOL/HDL Ratio: 3
VLDL: 30.4 mg/dL (ref 0.0–40.0)

## 2016-04-12 LAB — HEPATITIS C ANTIBODY: HCV AB: NEGATIVE

## 2016-04-12 LAB — BASIC METABOLIC PANEL
BUN: 18 mg/dL (ref 6–23)
CALCIUM: 10.5 mg/dL (ref 8.4–10.5)
CO2: 32 mEq/L (ref 19–32)
CREATININE: 0.82 mg/dL (ref 0.40–1.20)
Chloride: 104 mEq/L (ref 96–112)
GFR: 74.22 mL/min (ref >60.00)
Glucose, Bld: 108 mg/dL — ABNORMAL HIGH (ref 70–99)
Potassium: 4.9 mEq/L (ref 3.5–5.1)
Sodium: 143 mEq/L (ref 135–145)

## 2016-04-12 LAB — CBC WITH DIFFERENTIAL/PLATELET
BASOS PCT: 0.5 % (ref 0.0–3.0)
Basophils Absolute: 0 10*3/uL (ref 0.0–0.1)
EOS ABS: 0.1 10*3/uL (ref 0.0–0.7)
EOS PCT: 0.8 % (ref 0.0–5.0)
HCT: 42.7 % (ref 36.0–46.0)
HEMOGLOBIN: 14.3 g/dL (ref 12.0–15.0)
LYMPHS PCT: 27.9 % (ref 12.0–46.0)
Lymphs Abs: 2.2 10*3/uL (ref 0.7–4.0)
MCHC: 33.6 g/dL (ref 30.0–36.0)
MCV: 95.3 fl (ref 78.0–100.0)
MONO ABS: 0.7 10*3/uL (ref 0.1–1.0)
Monocytes Relative: 9.1 % (ref 3.0–12.0)
Neutro Abs: 4.9 10*3/uL (ref 1.4–7.7)
Neutrophils Relative %: 61.7 % (ref 43.0–77.0)
Platelets: 253 10*3/uL (ref 150.0–400.0)
RBC: 4.48 Mil/uL (ref 3.87–5.11)
RDW: 14.1 % (ref 11.5–15.5)
WBC: 8 10*3/uL (ref 4.0–10.5)

## 2016-04-12 LAB — HEPATIC FUNCTION PANEL
ALT: 23 U/L (ref 0–35)
AST: 26 U/L (ref 0–37)
Albumin: 4.3 g/dL (ref 3.5–5.2)
Alkaline Phosphatase: 63 U/L (ref 39–117)
BILIRUBIN TOTAL: 0.8 mg/dL (ref 0.2–1.2)
Bilirubin, Direct: 0.1 mg/dL (ref 0.0–0.3)
Total Protein: 7.4 g/dL (ref 6.0–8.3)

## 2016-04-12 LAB — TSH: TSH: 3.07 u[IU]/mL (ref 0.35–4.50)

## 2016-04-12 NOTE — Assessment & Plan Note (Signed)
Pt's PE WNL w/ exception of poor dentition.  UTD on colonoscopy, DEXA.  mammo scheduled for next week.  Prevnar given today.  Written screening schedule updated and given to pt.  EKG done- unchanged from previous.  Pt given POA and living will forms to complete.  Check labs.  Anticipatory guidance provided.

## 2016-04-12 NOTE — Patient Instructions (Addendum)
Follow up in 6 months to recheck BP We'll notify you of your lab results and make any changes if needed Continue to work on healthy diet and regular exercise- you can do it! You are up to date on colonoscopy until 2021- yay!! You are up to date on bone density, your mammo is next week You are now up to date on your immunizations with today's Prevnar Complete the living will/power of attorney forms at your convenience Call with any questions or concerns [place]!!!

## 2016-04-12 NOTE — Assessment & Plan Note (Signed)
Chronic problem.  Adequate control on meds.  Asymptomatic at this time.  Check labs.  No anticipated med changes.

## 2016-04-12 NOTE — Progress Notes (Signed)
   Subjective:    Patient ID: Mary Cohen, female    DOB: October 18, 1950, 66 y.o.   MRN: [id number]  HPI Here today for CPE.  Risk Factors: HTN- chronic problem, on Atenolol-chlorthalidone w/ good BP control Hypothyroid- chronic problem, on Levothyroxine 4mcg daily Physical Activity: exercising regularly Fall Risk: low Depression: denies Hearing: normal to conversational tones and whispered voice at 6 ft ADL's: independent Cognitive: normal linear thought process, memory and attention intact Home Safety: safe at home, lives w/  Height, Weight, BMI, Visual Acuity: see vitals, vision corrected to 20/20 w/ glasses Counseling: UTD on colonoscopy (due 2021), UTD on DEXA, mammo scheduled for next week, due for Prevnar, UTD on Tdap Care team reviewed and updated HealthCare POA/Living Will: pt does not have, packet provided today Labs Ordered: See A&P Care Plan: See A&P    Review of Systems Patient reports no vision/ hearing changes, adenopathy,fever, weight change,  persistant/recurrent hoarseness , swallowing issues, chest pain, palpitations, edema, persistant/recurrent cough, hemoptysis, dyspnea (rest/exertional/paroxysmal nocturnal), gastrointestinal bleeding (melena, rectal bleeding), abdominal pain, significant heartburn, bowel changes, GU symptoms (dysuria, hematuria, incontinence), Gyn symptoms (abnormal  bleeding, pain),  syncope, focal weakness, memory loss, numbness & tingling, skin/hair/nail changes, abnormal bruising or bleeding, anxiety, or depression.     Objective:   Physical Exam General Appearance:    Alert, cooperative, no distress, appears stated age  Head:    Normocephalic, without obvious abnormality, atraumatic  Eyes:    PERRL, conjunctiva/corneas clear, EOM's intact, fundi    benign, both eyes  Ears:    Normal TM's and external ear canals, both ears  Nose:   Nares normal, septum midline, mucosa normal, no drainage    or sinus tenderness  Throat:   Lips, mucosa,  and tongue normal; poor dentition  Neck:   Supple, symmetrical, trachea midline, no adenopathy;    Thyroid: no enlargement/tenderness/nodules  Back:     Symmetric, no curvature, ROM normal, no CVA tenderness  Lungs:     Clear to auscultation bilaterally, respirations unlabored  Chest Wall:    No tenderness or deformity   Heart:    Regular rate and rhythm, S1 and S2 normal, no murmur, rub   or gallop  Breast Exam:    Deferred to mammo  Abdomen:     Soft, non-tender, bowel sounds active all four quadrants,    no masses, no organomegaly  Genitalia:    Deferred  Rectal:    Extremities:   Extremities normal, atraumatic, no cyanosis or edema  Pulses:   2+ and symmetric all extremities  Skin:   Skin color, texture, turgor normal, no rashes or lesions  Lymph nodes:   Cervical, supraclavicular, and axillary nodes normal  Neurologic:   CNII-XII intact, normal strength, sensation and reflexes    throughout          Assessment & Plan:

## 2016-04-12 NOTE — Progress Notes (Signed)
Pre visit review using our clinic review tool, if applicable. No additional management support is needed unless otherwise documented below in the visit note. 

## 2016-04-12 NOTE — Assessment & Plan Note (Signed)
Chronic problem.  On Levothyroxine 82mcg daily.  Asymptomatic.  Check labs.  Adjust meds prn

## 2016-04-13 ENCOUNTER — Other Ambulatory Visit (INDEPENDENT_AMBULATORY_CARE_PROVIDER_SITE_OTHER): Payer: Medicare HMO

## 2016-04-13 ENCOUNTER — Other Ambulatory Visit: Payer: Self-pay | Admitting: Family Medicine

## 2016-04-13 DIAGNOSIS — R7309 Other abnormal glucose: Secondary | ICD-10-CM | POA: Diagnosis not present

## 2016-04-13 DIAGNOSIS — Z1231 Encounter for screening mammogram for malignant neoplasm of breast: Secondary | ICD-10-CM

## 2016-04-13 LAB — HEMOGLOBIN A1C: Hgb A1c MFr Bld: 5.4 % (ref 4.6–6.5)

## 2016-04-16 ENCOUNTER — Ambulatory Visit (HOSPITAL_BASED_OUTPATIENT_CLINIC_OR_DEPARTMENT_OTHER)
Admission: RE | Admit: 2016-04-16 | Discharge: 2016-04-16 | Disposition: A | Discharge status: Home / Self Care | Payer: Medicare HMO | Source: Ambulatory Visit | Attending: Family Medicine | Admitting: Family Medicine

## 2016-04-16 ENCOUNTER — Encounter (HOSPITAL_BASED_OUTPATIENT_CLINIC_OR_DEPARTMENT_OTHER): Payer: Self-pay

## 2016-04-16 ENCOUNTER — Encounter: Payer: Self-pay | Admitting: General Practice

## 2016-04-16 DIAGNOSIS — Z1231 Encounter for screening mammogram for malignant neoplasm of breast: Secondary | ICD-10-CM

## 2016-05-10 ENCOUNTER — Other Ambulatory Visit: Payer: Self-pay | Admitting: Family Medicine

## 2016-08-05 ENCOUNTER — Other Ambulatory Visit: Payer: Self-pay | Admitting: Family Medicine

## 2016-08-17 DIAGNOSIS — H10022 Other mucopurulent conjunctivitis, left eye: Secondary | ICD-10-CM | POA: Diagnosis not present

## 2016-08-27 ENCOUNTER — Other Ambulatory Visit: Payer: Self-pay | Admitting: Family Medicine

## 2016-08-28 ENCOUNTER — Ambulatory Visit (INDEPENDENT_AMBULATORY_CARE_PROVIDER_SITE_OTHER): Payer: Medicare HMO | Admitting: Family Medicine

## 2016-08-28 ENCOUNTER — Encounter: Payer: Self-pay | Admitting: Family Medicine

## 2016-08-28 VITALS — BP 100/66 | HR 53 | Temp 98.1°F | Resp 16 | Ht 65.0 in | Wt 146.6 lb

## 2016-08-28 DIAGNOSIS — S0502XA Injury of conjunctiva and corneal abrasion without foreign body, left eye, initial encounter: Secondary | ICD-10-CM | POA: Diagnosis not present

## 2016-08-28 MED ORDER — MOXIFLOXACIN HCL 0.5 % OP SOLN: 1.0000 [drp] | Freq: Three times a day (TID) | OPHTHALMIC | 0 refills | Status: DC

## 2016-08-28 NOTE — Progress Notes (Signed)
Patient ID: Mary Cohen, female   DOB: [birth year], 66 y.o.   MRN: [id number]     Subjective:  I acted as a Education administrator for Dr. Carollee Herter.  Guerry Bruin, [place]   Patient ID: Mary Cohen, female    DOB: [birth year], 66 y.o.   MRN: [id number]  Chief Complaint  Patient presents with  . Conjunctivitis    left eye    Conjunctivitis   Episode onset: a month ago. Associated symptoms include eye discharge and eye redness. Pertinent negatives include no orthopnea, no fever, no decreased vision, no double vision, no eye itching, no photophobia, no abdominal pain, no constipation, no diarrhea, no nausea, no vomiting, no congestion, no ear discharge, no ear pain, no headaches, no hearing loss, no mouth sores, no rhinorrhea, no sore throat, no stridor, no swollen glands, no muscle aches, no neck pain, no neck stiffness, no cough, no URI, no rash, no diaper rash and no eye pain. The left eye is affected. The eyelid exhibits redness.    Patient is in today for possible pink eye.   Symptoms started about a month ago.  [facility] and they gave an antibiotic drop Tobramycin.  Did not go completely away and about 2 days ago it started back again.  Patient Care Team: Midge Minium, MD as PCP - General (Family Medicine) Kem Boroughs, [place] as Nurse Practitioner (Family Medicine) Delaney Meigs, MD as Consulting Physician (Gastroenterology)   Past Medical History:  Diagnosis Date  . Fibroid 1995  . Hypertension 4/96  . Ovarian mass 04/1998   right ovarian mass  . Thyroid disease 03/1993   hypothyroidism  . Tremors of nervous system     Past Surgical History:  Procedure Laterality Date  . ABDOMINAL HYSTERECTOMY  07/1996  . BILATERAL SALPINGOOPHORECTOMY  05/1998   parasitic fibroid  . COLONOSCOPY  11/2006   negative, recheck in 10 years  . TONSILLECTOMY AND ADENOIDECTOMY  3rd grade  . TUBAL LIGATION Bilateral 1985  . VAGINAL DELIVERY  07/1996   secondary to  fibroids and adenomyosis, ovaries remain    Family History  Problem Relation Age of Onset  . Hypertension Mother   . Diabetes Mother   . Hyperlipidemia Mother   . Atrial fibrillation Mother   . Hypertension Father   . Stroke Father   . Heart disease Father        A fib, CABG in past  . Thyroid disease Sister     Social History   Social History  . Marital status: Married    Spouse name: N/A  . Number of children: N/A  . Years of education: N/A   Occupational History  . Not on file.   Social History Main Topics  . Smoking status: Never Smoker  . Smokeless tobacco: Never Used  . Alcohol use 1.5 oz/week    3 Standard drinks or equivalent per week  . Drug use: No  . Sexual activity: Yes    Birth control/ protection: Surgical     Comment: hysterectomy   Other Topics Concern  . Not on file   Social History Narrative  . No narrative on file    Outpatient Medications Prior to Visit  Medication Sig Dispense Refill  . atenolol-chlorthalidone (TENORETIC) 50-25 MG tablet TAKE 1 TABLET BY MOUTH DAILY. 90 tablet 0  . calcium carbonate 1250 MG capsule Take 1,250 mg by mouth 2 (two) times daily with a meal.    . Cholecalciferol (VITAMIN D3)  10000 UNITS capsule Take 10,000 Units by mouth once a week.    . levothyroxine (SYNTHROID, LEVOTHROID) 25 MCG tablet TAKE 1 TABLET BY MOUTH DAILY BEFORE BREAKFAST 90 tablet 0  . Multiple Vitamin (MULTIVITAMIN) tablet Take 1 tablet by mouth daily.     No facility-administered medications prior to visit.     Allergies  Allergen Reactions  . Codeine Nausea And Vomiting    Review of Systems  Constitutional: Negative for fever.  HENT: Negative for congestion, ear discharge, ear pain, hearing loss, mouth sores, rhinorrhea and sore throat.   Eyes: Positive for discharge and redness. Negative for double vision, photophobia, pain and itching.  Respiratory: Negative for cough and stridor.   Cardiovascular: Negative for orthopnea.    Gastrointestinal: Negative for abdominal pain, constipation, diarrhea, nausea and vomiting.  Musculoskeletal: Negative for neck pain.  Skin: Negative for rash.  Neurological: Negative for headaches.       Objective:    Physical Exam  Eyes: Pupils are equal, round, and reactive to light. Right eye exhibits hordeolum. Right eye exhibits no chemosis, no discharge and no exudate. No foreign body present in the right eye. Left eye exhibits no discharge, no exudate and no hordeolum. No foreign body present in the left eye. Right conjunctiva is not injected. Right conjunctiva has no hemorrhage. Left conjunctiva is injected. Left conjunctiva has no hemorrhage. Right eye exhibits normal extraocular motion and no nystagmus. Left eye exhibits normal extraocular motion and no nystagmus.    Nursing note and vitals reviewed.   BP 100/66 (BP Location: Left Arm, Cuff Size: Normal)   Pulse (!) 53   Temp 98.1 F (36.7 C) (Oral)   Resp 16   Ht 5\' 5"  (1.651 m)   Wt 146 lb 9.6 oz (66.5 kg)   SpO2 98%   BMI 24.40 kg/m  Wt Readings from Last 3 Encounters:  08/28/16 146 lb 9.6 oz (66.5 kg)  04/12/16 153 lb 8 oz (69.6 kg)  03/02/15 137 lb 6.4 oz (62.3 kg)   BP Readings from Last 3 Encounters:  08/28/16 100/66  04/12/16 130/80  03/02/15 128/76     Immunization History  Administered Date(s) Administered  . DTaP 08/13/2011  . Influenza,inj,Quad PF,36+ Mos 10/18/2015  . Influenza-Unspecified 11/06/2014  . Pneumococcal Conjugate-13 04/12/2016  . Td 02/05/2013    Health Maintenance  Topic Date Due  . HIV Screening  11/05/2016 (Originally 09/02/1965)  . INFLUENZA VACCINE  09/05/2016  . PNA vac Low Risk Adult (2 of 2 - PPSV23) 04/12/2017  . MAMMOGRAM  04/17/2018  . COLONOSCOPY  02/06/2019  . TETANUS/TDAP  02/06/2023  . DEXA SCAN  Completed  . Hepatitis C Screening  Completed    Lab Results  Component Value Date   WBC 8.0 04/12/2016   HGB 14.3 04/12/2016   HCT 42.7 04/12/2016   PLT 253.0  04/12/2016   GLUCOSE 108 (H) 04/12/2016   CHOL 235 (H) 04/12/2016   TRIG 152.0 (H) 04/12/2016   HDL 67.40 04/12/2016   LDLCALC 138 (H) 04/12/2016   ALT 23 04/12/2016   AST 26 04/12/2016   NA 143 04/12/2016   K 4.9 04/12/2016   CL 104 04/12/2016   CREATININE 0.82 04/12/2016   BUN 18 04/12/2016   CO2 32 04/12/2016   TSH 3.07 04/12/2016   HGBA1C 5.4 04/13/2016    Lab Results  Component Value Date   TSH 3.07 04/12/2016   Lab Results  Component Value Date   WBC 8.0 04/12/2016   HGB 14.3 04/12/2016  HCT 42.7 04/12/2016   MCV 95.3 04/12/2016   PLT 253.0 04/12/2016   Lab Results  Component Value Date   NA 143 04/12/2016   K 4.9 04/12/2016   CO2 32 04/12/2016   GLUCOSE 108 (H) 04/12/2016   BUN 18 04/12/2016   CREATININE 0.82 04/12/2016   BILITOT 0.8 04/12/2016   ALKPHOS 63 04/12/2016   AST 26 04/12/2016   ALT 23 04/12/2016   PROT 7.4 04/12/2016   ALBUMIN 4.3 04/12/2016   CALCIUM 10.5 04/12/2016   GFR 74.22 04/12/2016   Lab Results  Component Value Date   CHOL 235 (H) 04/12/2016   Lab Results  Component Value Date   HDL 67.40 04/12/2016   Lab Results  Component Value Date   LDLCALC 138 (H) 04/12/2016   Lab Results  Component Value Date   TRIG 152.0 (H) 04/12/2016   Lab Results  Component Value Date   CHOLHDL 3 04/12/2016   Lab Results  Component Value Date   HGBA1C 5.4 04/13/2016         Assessment & Plan:   Problem List Items Addressed This Visit      Unprioritized   Left corneal abrasion - Primary    abx gtts per orders ophth if no improvement or if symptoms worsen in 2-3 days  Cool compresses      Relevant Medications   moxifloxacin (VIGAMOX) 0.5 % ophthalmic solution      I have discontinued Ms. Amador's tobramycin. I am also having her start on moxifloxacin. Additionally, I am having her maintain her calcium carbonate, multivitamin, Vitamin D3, atenolol-chlorthalidone, and levothyroxine.  Meds ordered this encounter    Medications  . DISCONTD: tobramycin (TOBREX) 0.3 % ophthalmic solution    Sig: Place 2 drops into the left eye every 4 (four) hours.    Refill:  0  . moxifloxacin (VIGAMOX) 0.5 % ophthalmic solution    Sig: Place 1 drop into the left eye 3 (three) times daily.    Dispense:  3 mL    Refill:  0    CMA served as scribe during this visit. History, Physical and Plan performed by medical provider. Documentation and orders reviewed and attested to.  Ann Held, DO

## 2016-08-28 NOTE — Patient Instructions (Signed)
Corneal Abrasion A corneal abrasion is a scratch or injury to the clear covering over the front of your eye (cornea). Your cornea forms a clear dome that protects your eye and helps to focus your vision. Your cornea is made up of many layers. The surface layer is a single layer of cells (corneal epithelium). It is one of the most sensitive tissues in your body. A corneal abrasion can be very painful. If a corneal abrasion is not treated, it can become infected and cause an ulcer. This can lead to scarring. A scarred cornea can affect your vision. Sometimes abrasions come back in the same area, even after the original injury has healed (recurrent erosion syndrome). What are the causes? This condition may be caused by:  A poke in the eye.  A gritty or irritating substance (foreign body) in the eye.  Excessive eye rubbing.  Very dry eyes.  Certain eye infections.  Contact lenses that fit poorly or are worn for a long period of time. You can also injure your cornea when putting contacts lenses in your eye or taking them out.  Eye surgery.  Sometimes, the cause is unknown. What are the signs or symptoms? Symptoms of this condition include:  Eye pain. The pain may get worse when your eye is open or when you move your eye.  A feeling of something stuck in your eye.  Having trouble keeping your eye open, or not being able to keep it open.  Tearing and redness.  Sensitivity to light.  Blurred vision.  Headache.  How is this diagnosed? This condition may be diagnosed based on:  Your medical history.  Your symptoms.  An eye exam. You may work with a health care provider who specializes in diseases and conditions of the eye (ophthalmologist). Before the eye exam, numbing drops may be put into your eye. You may also have dye put in your eye with a dropper or a small paper strip. The dye makes the abrasion easy to see when your ophthalmologist examines your eye with a light. Your  ophthalmologist may look at your eye through an eye scope (slit lamp).  How is this treated? Treatment may vary depending on the cause of your condition, and it may include:  Washing out your eye.  Removing any foreign body.  Antibiotic drops or ointment to treat an infection.  Steroid drops or ointment to treat redness, irritation, or inflammation.  Pain medicine.  An eye patch to keep your eye closed.  Follow these instructions at home: Medicines  Use eye drops or ointments as told by your eye care provider.  If you were prescribed antibiotic drops or ointment, use them as told by your eye care provider. Do not stop using the antibiotic even if you start to feel better.  Take over-the-counter and prescription medicines only as told by your eye care provider.  Do not drive or use heavy machinery while taking prescription pain medicine. General instructions  If you have an eye patch, wear it as told by your eye care provider. ? Do not drive or use machinery while wearing an eye patch. Your ability to judge distances will be impaired. ? Follow instructions from your eye care provider about when to remove the patch.  Ask your eye care provider whether you can use a cold, wet cloth (compress) on your eye to relieve pain.  Do not rub or touch your eye. Do not wash out your eye.  Do not wear contact lenses until  your eye care provider says that this is okay.  Avoid bright light and eye strain.  Keep all follow-up visits as told by your eye care provider. This is important for preventing infection and vision loss. Contact a health care provider if:  You continue to have eye pain and other symptoms for more than 2 days.  You develop new symptoms, such as redness, tearing, or discharge.  You have discharge that makes your eyelids stick together in the morning.  Your eye patch becomes so loose that you can blink your eye.  Symptoms return after the original abrasion has  healed. Get help right away if:  You have severe eye pain that does not get better with medicine.  You have vision loss. Summary  A corneal abrasion is a scratch on the outer layer of the clear covering over the front of your eye (cornea).  Corneal abrasion can cause eye pain, redness, tearing, and blurred vision.  This condition is usually treated with medicine to prevent infection and scarring. You also may have to wear an eye patch to cover your eye.  Let your eye care provider know if your symptoms continue for more than 2 days. This information is not intended to replace advice given to you by your health care provider. Make sure you discuss any questions you have with your health care provider. Document Released: 01/20/2000 Document Revised: 01/03/2016 Document Reviewed: 01/03/2016 Elsevier Interactive Patient Education  2017 [employer]  

## 2016-08-29 ENCOUNTER — Other Ambulatory Visit: Payer: Self-pay | Admitting: Family Medicine

## 2016-08-29 DIAGNOSIS — S0502XA Injury of conjunctiva and corneal abrasion without foreign body, left eye, initial encounter: Secondary | ICD-10-CM | POA: Insufficient documentation

## 2016-08-29 NOTE — Assessment & Plan Note (Addendum)
abx gtts per orders ophth if no improvement or if symptoms worsen in 2-3 days  Cool compresses

## 2016-10-15 ENCOUNTER — Encounter: Payer: Self-pay | Admitting: Family Medicine

## 2016-10-15 ENCOUNTER — Ambulatory Visit (INDEPENDENT_AMBULATORY_CARE_PROVIDER_SITE_OTHER): Payer: Medicare HMO | Admitting: Family Medicine

## 2016-10-15 VITALS — BP 130/81 | HR 76 | Temp 97.9°F | Resp 16 | Ht 60.0 in | Wt 141.4 lb

## 2016-10-15 DIAGNOSIS — E039 Hypothyroidism, unspecified: Secondary | ICD-10-CM

## 2016-10-15 DIAGNOSIS — I10 Essential (primary) hypertension: Secondary | ICD-10-CM

## 2016-10-15 DIAGNOSIS — E785 Hyperlipidemia, unspecified: Secondary | ICD-10-CM

## 2016-10-15 DIAGNOSIS — Z23 Encounter for immunization: Secondary | ICD-10-CM | POA: Diagnosis not present

## 2016-10-15 LAB — CBC WITH DIFFERENTIAL/PLATELET
BASOS ABS: 0 10*3/uL (ref 0.0–0.1)
Basophils Relative: 0.3 % (ref 0.0–3.0)
Eosinophils Absolute: 0 10*3/uL (ref 0.0–0.7)
Eosinophils Relative: 0.6 % (ref 0.0–5.0)
HCT: 44.4 % (ref 36.0–46.0)
Hemoglobin: 14.9 g/dL (ref 12.0–15.0)
LYMPHS ABS: 1.6 10*3/uL (ref 0.7–4.0)
LYMPHS PCT: 23.5 % (ref 12.0–46.0)
MCHC: 33.5 g/dL (ref 30.0–36.0)
MCV: 95.5 fl (ref 78.0–100.0)
MONOS PCT: 8.9 % (ref 3.0–12.0)
Monocytes Absolute: 0.6 10*3/uL (ref 0.1–1.0)
NEUTROS PCT: 66.7 % (ref 43.0–77.0)
Neutro Abs: 4.6 10*3/uL (ref 1.4–7.7)
Platelets: 237 10*3/uL (ref 150.0–400.0)
RBC: 4.65 Mil/uL (ref 3.87–5.11)
RDW: 14.1 % (ref 11.5–15.5)
WBC: 6.9 10*3/uL (ref 4.0–10.5)

## 2016-10-15 LAB — HEPATIC FUNCTION PANEL
ALBUMIN: 4.2 g/dL (ref 3.5–5.2)
ALK PHOS: 60 U/L (ref 39–117)
ALT: 17 U/L (ref 0–35)
AST: 20 U/L (ref 0–37)
Bilirubin, Direct: 0.1 mg/dL (ref 0.0–0.3)
TOTAL PROTEIN: 6.8 g/dL (ref 6.0–8.3)
Total Bilirubin: 0.6 mg/dL (ref 0.2–1.2)

## 2016-10-15 LAB — TSH: TSH: 1.66 u[IU]/mL (ref 0.35–4.50)

## 2016-10-15 LAB — LIPID PANEL
CHOLESTEROL: 196 mg/dL (ref 0–200)
HDL: 59.7 mg/dL (ref >39.00)
LDL Cholesterol: 116 mg/dL — ABNORMAL HIGH (ref 0–99)
NONHDL: 136.37
Total CHOL/HDL Ratio: 3
Triglycerides: 102 mg/dL (ref 0.0–149.0)
VLDL: 20.4 mg/dL (ref 0.0–40.0)

## 2016-10-15 LAB — BASIC METABOLIC PANEL
BUN: 19 mg/dL (ref 6–23)
CALCIUM: 9.8 mg/dL (ref 8.4–10.5)
CO2: 28 meq/L (ref 19–32)
Chloride: 102 mEq/L (ref 96–112)
Creatinine, Ser: 0.76 mg/dL (ref 0.40–1.20)
GFR: 80.9 mL/min (ref >60.00)
Glucose, Bld: 101 mg/dL — ABNORMAL HIGH (ref 70–99)
POTASSIUM: 4.5 meq/L (ref 3.5–5.1)
SODIUM: 141 meq/L (ref 135–145)

## 2016-10-15 MED ORDER — ATENOLOL-CHLORTHALIDONE 50-25 MG PO TABS: 1.0000 | ORAL_TABLET | Freq: Every day | ORAL | 0 refills | Status: DC

## 2016-10-15 MED ORDER — LEVOTHYROXINE SODIUM 25 MCG PO TABS: ORAL_TABLET | ORAL | 0 refills | Status: DC

## 2016-10-15 NOTE — Assessment & Plan Note (Signed)
Chronic problem.  Adequate control today.  Asymptomatic.  Check labs.  No anticipated med changes.  Will follow. 

## 2016-10-15 NOTE — Patient Instructions (Signed)
Schedule your complete physical and Medicare Wellness Visit in 6 months We'll notify you of your lab results and make any changes if needed Continue to work on healthy diet and regular exercise- you look great! Call with any questions or concerns Hang in there!!!

## 2016-10-15 NOTE — Progress Notes (Signed)
   Subjective:    Patient ID: Mary Cohen, female    DOB: 1950/09/29, 66 y.o.   MRN: [id number]  HPI HTN- chronic problem, on Atenolol-Chlorthalidone 50/25mg  daily w/ adequate control.  Denies CP, SOB, HAs, visual changes, edema.  Hypothyroid- chronic problem, on Levothyroxine 32mcg.  Denies fatigue, changes to skin/hair/nails.  Hyperlipidemia- chronic problem, attempting to control w/ diet and exercise.  Pt is down 6 lbs.  Exercising regularly.  Denies abd pain, N/V, myalgias.   Review of Systems For ROS see HPI     Objective:   Physical Exam  Constitutional: She is oriented to person, place, and time. She appears well-developed and well-nourished. No distress.  HENT:  Head: Normocephalic and atraumatic.  Eyes: Pupils are equal, round, and reactive to light. Conjunctivae and EOM are normal.  Neck: Normal range of motion. Neck supple. No thyromegaly present.  Cardiovascular: Normal rate, regular rhythm, normal heart sounds and intact distal pulses.   No murmur heard. Pulmonary/Chest: Effort normal and breath sounds normal. No respiratory distress.  Abdominal: Soft. She exhibits no distension. There is no tenderness.  Musculoskeletal: She exhibits no edema.  Lymphadenopathy:    She has no cervical adenopathy.  Neurological: She is alert and oriented to person, place, and time.  Skin: Skin is warm and dry.  Psychiatric: She has a normal mood and affect. Her behavior is normal.  Vitals reviewed.         Assessment & Plan:  Hyperlipidemia- ongoing issue.  Attempting to control w/ diet and exercise.  Check labs.  Start meds prn.

## 2016-10-15 NOTE — Progress Notes (Signed)
Pre visit review using our clinic review tool, if applicable. No additional management support is needed unless otherwise documented below in the visit note. 

## 2016-10-15 NOTE — Assessment & Plan Note (Signed)
Chronic problem.  Asymptomatic.  Check labs.  Adjust meds prn  

## 2016-11-22 ENCOUNTER — Other Ambulatory Visit: Payer: Self-pay | Admitting: Family Medicine

## 2017-02-28 ENCOUNTER — Other Ambulatory Visit: Payer: Self-pay | Admitting: Family Medicine

## 2017-03-14 ENCOUNTER — Other Ambulatory Visit: Payer: Self-pay | Admitting: Family Medicine

## 2017-03-14 DIAGNOSIS — Z1231 Encounter for screening mammogram for malignant neoplasm of breast: Secondary | ICD-10-CM

## 2017-04-17 NOTE — Progress Notes (Addendum)
Subjective:   Mary Cohen is a 67 y.o. female who presents for Medicare Annual (Subsequent) preventive examination.  Review of Systems:  No ROS.  Medicare Wellness Visit. Additional risk factors are reflected in the social history.  Cardiac Risk Factors include: advanced age (>38men, >89 women);hypertension;family history of premature cardiovascular disease   Sleep patterns: Sleeps 8 hours.  Home Safety/Smoke Alarms: Feels safe in home. Smoke alarms in place.  Living environment; residence and Firearm Safety: Lives with husband in 2 story home.  Seat Belt Safety/Bike Helmet: Wears seat belt.   Female:   Pap-02/05/2013       Mammo-04/16/2016,  BI-RADS CATEGORY  1: Negative. Scheduled 04/18/17.       Dexa scan-03/08/2015, Osteopenia.  Ordered today.    [facility]-Colonoscopy 02/05/2009. Pt reported normal, recall 10 years. Dr. Tilman Neat     Objective:     Vitals: BP (!) 142/70 (BP Location: Left Arm, Patient Position: Sitting, Cuff Size: Normal)   Pulse (!) 47   Temp 97.8 F (36.6 C) (Temporal)   Resp 16   Ht 5' (1.524 m)   Wt 156 lb 9.6 oz (71 kg)   SpO2 98%   BMI 30.58 kg/m   Body mass index is 30.58 kg/m.  Advanced Directives 04/18/2017 04/12/2016  Does Patient Have a Medical Advance Directive? No No  Would patient like information on creating a medical advance directive? Yes (MAU/Ambulatory/Procedural Areas - Information given) Yes (MAU/Ambulatory/Procedural Areas - Information given)    Tobacco Social History   Tobacco Use  Smoking Status Never Smoker  Smokeless Tobacco Never Used     Counseling given: Not Answered    Past Medical History:  Diagnosis Date  . Fibroid 1995  . Hypertension 4/96  . Ovarian mass 04/1998   right ovarian mass  . Thyroid disease 03/1993   hypothyroidism  . Tremors of nervous system    Past Surgical History:  Procedure Laterality Date  . ABDOMINAL HYSTERECTOMY  07/1996  . BILATERAL SALPINGOOPHORECTOMY  05/1998   parasitic  fibroid  . COLONOSCOPY  11/2006   negative, recheck in 10 years  . TONSILLECTOMY AND ADENOIDECTOMY  3rd grade  . TUBAL LIGATION Bilateral 1985  . VAGINAL DELIVERY  07/1996   secondary to fibroids and adenomyosis, ovaries remain   Family History  Problem Relation Age of Onset  . Hypertension Mother   . Diabetes Mother   . Hyperlipidemia Mother   . Atrial fibrillation Mother   . Hypertension Father   . Stroke Father   . Heart disease Father        A fib, CABG in past  . Thyroid disease Sister    Social History   Socioeconomic History  . Marital status: Married    Spouse name: None  . Number of children: None  . Years of education: None  . Highest education level: None  Social Needs  . Financial resource strain: None  . Food insecurity - worry: None  . Food insecurity - inability: None  . Transportation needs - medical: None  . Transportation needs - non-medical: None  Occupational History  . None  Tobacco Use  . Smoking status: Never Smoker  . Smokeless tobacco: Never Used  Substance and Sexual Activity  . Alcohol use: Yes    Alcohol/week: 1.5 oz    Types: 3 Standard drinks or equivalent per week  . Drug use: No  . Sexual activity: Yes    Birth control/protection: Surgical    Comment: hysterectomy  Other Topics  Concern  . None  Social History Narrative  . None    Outpatient Encounter Medications as of 04/18/2017  Medication Sig  . atenolol-chlorthalidone (TENORETIC) 50-25 MG tablet TAKE 1 TABLET BY MOUTH DAILY.  . calcium carbonate 1250 MG capsule Take 1,250 mg by mouth 2 (two) times daily with a meal.  . Cholecalciferol (VITAMIN D3) 10000 UNITS capsule Take 10,000 Units by mouth once a week.  . levothyroxine (SYNTHROID, LEVOTHROID) 25 MCG tablet TAKE 1 TABLET BY MOUTH EVERY DAY BEFORE BREAKFAST  . Multiple Vitamin (MULTIVITAMIN) tablet Take 1 tablet by mouth daily.  Marland Kitchen moxifloxacin (VIGAMOX) 0.5 % ophthalmic solution Place 1 drop into the left eye 3 (three)  times daily.  Marland Kitchen Zoster Vaccine Adjuvanted [facility] injection Inject 0.5 mLs into the muscle once for 1 dose.   No facility-administered encounter medications on file as of 04/18/2017.     Activities of Daily Living In your present state of health, do you have any difficulty performing the following activities: 04/18/2017 10/15/2016  Hearing? Y N  Vision? N N  Difficulty concentrating or making decisions? N N  Walking or climbing stairs? N N  Dressing or bathing? N N  Doing errands, shopping? N N  Preparing Food and eating ? N -  Using the Toilet? N -  In the past six months, have you accidently leaked urine? N -  Do you have problems with loss of bowel control? N -  Managing your Medications? N -  Managing your Finances? N -  Housekeeping or managing your Housekeeping? N -  Some recent data might be hidden    Patient Care Team: Midge Minium, MD as PCP - General (Family Medicine) Delaney Meigs, MD as Consulting Physician (Gastroenterology)    Assessment:   This is a routine wellness examination for [place].  Exercise Activities and Dietary recommendations Current Exercise Habits: Structured exercise class, Type of exercise: treadmill(swimming), Frequency (Times/Week): 2, Exercise limited by: None identified   Diet (meal preparation, eat out, water intake, caffeinated beverages, dairy products, fruits and vegetables): Drinks coffee and water.   Breakfast: egg, grits, oatmeal, toast Lunch: sandwich Dinner: protein and vegetables/carbs.  Goals    . Weight (lb) < 140 lb (63.5 kg)     Lose weight by increasing activity.        Fall Risk Fall Risk  04/18/2017 04/12/2016 03/02/2015 10/14/2013  Falls in the past year? No No No No   Depression Screen PHQ 2/9 Scores 04/18/2017 10/15/2016 04/12/2016 03/02/2015  PHQ - 2 Score 0 0 0 0  PHQ- 9 Score - 0 0 -  Exception Documentation - - - Patient refusal     Cognitive Function       Ad8 score reviewed for issues:  Issues  making decisions: no  Less interest in hobbies / activities: no  Repeats questions, stories (family complaining): no  Trouble using ordinary gadgets (microwave, computer, phone): no  Forgets the month or year: no  Mismanaging finances: no  Remembering appts: no  Daily problems with thinking and/or memory: no Ad8 score is= 0     Immunization History  Administered Date(s) Administered  . DTaP 08/13/2011  . Influenza,inj,Quad PF,6+ Mos 10/18/2015, 10/15/2016  . Influenza-Unspecified 11/06/2014  . Pneumococcal Conjugate-13 04/12/2016  . Pneumococcal Polysaccharide-23 04/18/2017  . Td 02/05/2013    Screening Tests Health Maintenance  Topic Date Due  . PNA vac Low Risk Adult (2 of 2 - PPSV23) 04/12/2017  . MAMMOGRAM  04/17/2018  . COLONOSCOPY  02/06/2019  .  TETANUS/TDAP  02/06/2023  . INFLUENZA VACCINE  Completed  . DEXA SCAN  Completed  . Hepatitis C Screening  Completed       Plan:    Shingles vaccine at pharmacy.   Schedule bone scan.   Continue doing brain stimulating activities (puzzles, reading, adult coloring books, staying active) to keep memory sharp.   Bring a copy of your living will and/or healthcare power of attorney to your next office visit.  I have personally reviewed and noted the following in the patient's chart:   . Medical and social history . Use of alcohol, tobacco or illicit drugs  . Current medications and supplements . Functional ability and status . Nutritional status . Physical activity . Advanced directives . List of other physicians . Hospitalizations, surgeries, and ER visits in previous 12 months . Vitals . Screenings to include cognitive, depression, and falls . Referrals and appointments  In addition, I have reviewed and discussed with patient certain preventive protocols, quality metrics, and best practice recommendations. A written personalized care plan for preventive services as well as general preventive health  recommendations were provided to patient.     Gerilyn Nestle, RN  04/18/2017  Reviewed documentation provided by RN and agree w/ above.  Annye Asa, MD

## 2017-04-18 ENCOUNTER — Other Ambulatory Visit: Payer: Self-pay

## 2017-04-18 ENCOUNTER — Ambulatory Visit (HOSPITAL_BASED_OUTPATIENT_CLINIC_OR_DEPARTMENT_OTHER)
Admission: RE | Admit: 2017-04-18 | Discharge: 2017-04-18 | Disposition: A | Discharge status: Home / Self Care | Payer: Medicare HMO | Source: Ambulatory Visit | Attending: Family Medicine | Admitting: Family Medicine

## 2017-04-18 ENCOUNTER — Encounter: Payer: Self-pay | Admitting: Family Medicine

## 2017-04-18 ENCOUNTER — Encounter: Payer: Self-pay | Admitting: General Practice

## 2017-04-18 ENCOUNTER — Ambulatory Visit (INDEPENDENT_AMBULATORY_CARE_PROVIDER_SITE_OTHER): Payer: Medicare HMO | Admitting: Family Medicine

## 2017-04-18 ENCOUNTER — Ambulatory Visit (INDEPENDENT_AMBULATORY_CARE_PROVIDER_SITE_OTHER): Payer: Medicare HMO

## 2017-04-18 VITALS — BP 138/70 | HR 47 | Temp 97.8°F | Resp 16 | Ht 60.0 in | Wt 156.4 lb

## 2017-04-18 VITALS — BP 142/70 | HR 47 | Temp 97.8°F | Resp 16 | Ht 60.0 in | Wt 156.6 lb

## 2017-04-18 DIAGNOSIS — E2839 Other primary ovarian failure: Secondary | ICD-10-CM | POA: Insufficient documentation

## 2017-04-18 DIAGNOSIS — I10 Essential (primary) hypertension: Secondary | ICD-10-CM

## 2017-04-18 DIAGNOSIS — Z780 Asymptomatic menopausal state: Secondary | ICD-10-CM | POA: Diagnosis not present

## 2017-04-18 DIAGNOSIS — Z0000 Encounter for general adult medical examination without abnormal findings: Secondary | ICD-10-CM | POA: Diagnosis not present

## 2017-04-18 DIAGNOSIS — Z13820 Encounter for screening for osteoporosis: Secondary | ICD-10-CM | POA: Diagnosis not present

## 2017-04-18 DIAGNOSIS — Z1231 Encounter for screening mammogram for malignant neoplasm of breast: Secondary | ICD-10-CM

## 2017-04-18 DIAGNOSIS — M85851 Other specified disorders of bone density and structure, right thigh: Secondary | ICD-10-CM | POA: Diagnosis not present

## 2017-04-18 DIAGNOSIS — Z23 Encounter for immunization: Secondary | ICD-10-CM | POA: Diagnosis not present

## 2017-04-18 LAB — HEPATIC FUNCTION PANEL
ALBUMIN: 4.3 g/dL (ref 3.5–5.2)
ALK PHOS: 63 U/L (ref 39–117)
ALT: 21 U/L (ref 0–35)
AST: 24 U/L (ref 0–37)
Bilirubin, Direct: 0.1 mg/dL (ref 0.0–0.3)
TOTAL PROTEIN: 7.2 g/dL (ref 6.0–8.3)
Total Bilirubin: 0.9 mg/dL (ref 0.2–1.2)

## 2017-04-18 LAB — TSH: TSH: 2.48 u[IU]/mL (ref 0.35–4.50)

## 2017-04-18 LAB — CBC WITH DIFFERENTIAL/PLATELET
BASOS ABS: 0 10*3/uL (ref 0.0–0.1)
Basophils Relative: 0.3 % (ref 0.0–3.0)
EOS PCT: 0.6 % (ref 0.0–5.0)
Eosinophils Absolute: 0 10*3/uL (ref 0.0–0.7)
HCT: 43.4 % (ref 36.0–46.0)
Hemoglobin: 14.9 g/dL (ref 12.0–15.0)
LYMPHS ABS: 1.8 10*3/uL (ref 0.7–4.0)
Lymphocytes Relative: 26.8 % (ref 12.0–46.0)
MCHC: 34.4 g/dL (ref 30.0–36.0)
MCV: 94.4 fl (ref 78.0–100.0)
MONO ABS: 0.7 10*3/uL (ref 0.1–1.0)
Monocytes Relative: 10.1 % (ref 3.0–12.0)
NEUTROS PCT: 62.2 % (ref 43.0–77.0)
Neutro Abs: 4.3 10*3/uL (ref 1.4–7.7)
Platelets: 246 10*3/uL (ref 150.0–400.0)
RBC: 4.59 Mil/uL (ref 3.87–5.11)
RDW: 13.1 % (ref 11.5–15.5)
WBC: 6.9 10*3/uL (ref 4.0–10.5)

## 2017-04-18 LAB — LIPID PANEL
CHOLESTEROL: 222 mg/dL — AB (ref 0–200)
HDL: 69.2 mg/dL (ref >39.00)
LDL Cholesterol: 129 mg/dL — ABNORMAL HIGH (ref 0–99)
NonHDL: 152.57
TRIGLYCERIDES: 120 mg/dL (ref 0.0–149.0)
Total CHOL/HDL Ratio: 3
VLDL: 24 mg/dL (ref 0.0–40.0)

## 2017-04-18 LAB — BASIC METABOLIC PANEL
BUN: 14 mg/dL (ref 6–23)
CHLORIDE: 101 meq/L (ref 96–112)
CO2: 34 meq/L — AB (ref 19–32)
Calcium: 10.2 mg/dL (ref 8.4–10.5)
Creatinine, Ser: 0.83 mg/dL (ref 0.40–1.20)
GFR: 72.96 mL/min (ref >60.00)
GLUCOSE: 108 mg/dL — AB (ref 70–99)
POTASSIUM: 3.9 meq/L (ref 3.5–5.1)
Sodium: 141 mEq/L (ref 135–145)

## 2017-04-18 MED ORDER — ZOSTER VAC RECOMB ADJUVANTED 50 MCG/0.5ML IM SUSR: 0.5000 mL | Freq: Once | INTRAMUSCULAR | 1 refills | Status: AC

## 2017-04-18 NOTE — Assessment & Plan Note (Signed)
Pt's PE WNL.  UTD on colonoscopy, mammo and DEXA later today, UTD on immunizations.  Check labs.  Anticipatory guidance provided.

## 2017-04-18 NOTE — Patient Instructions (Addendum)
Shingles vaccine at pharmacy.   Schedule bone scan.   Continue doing brain stimulating activities (puzzles, reading, adult coloring books, staying active) to keep memory sharp.   Bring a copy of your living will and/or healthcare power of attorney to your next office visit.  Health Maintenance, Female Adopting a healthy lifestyle and getting preventive care can go a long way to promote health and wellness. Talk with your health care provider about what schedule of regular examinations is right for you. This is a good chance for you to check in with your provider about disease prevention and staying healthy. In between checkups, there are plenty of things you can do on your own. Experts have done a lot of research about which lifestyle changes and preventive measures are most likely to keep you healthy. Ask your health care provider for more information. Weight and diet Eat a healthy diet  Be sure to include plenty of vegetables, fruits, low-fat dairy products, and lean protein.  Do not eat a lot of foods high in solid fats, added sugars, or salt.  Get regular exercise. This is one of the most important things you can do for your health. ? Most adults should exercise for at least 150 minutes each week. The exercise should increase your heart rate and make you sweat (moderate-intensity exercise). ? Most adults should also do strengthening exercises at least twice a week. This is in addition to the moderate-intensity exercise.  Maintain a healthy weight  Body mass index (BMI) is a measurement that can be used to identify possible weight problems. It estimates body fat based on height and weight. Your health care provider can help determine your BMI and help you achieve or maintain a healthy weight.  For females 20 years of age and older: ? A BMI below 18.5 is considered underweight. ? A BMI of 18.5 to 24.9 is normal. ? A BMI of 25 to 29.9 is considered overweight. ? A BMI of 30 and above  is considered obese.  Watch levels of cholesterol and blood lipids  You should start having your blood tested for lipids and cholesterol at 67 years of age, then have this test every 5 years.  You may need to have your cholesterol levels checked more often if: ? Your lipid or cholesterol levels are high. ? You are older than 67 years of age. ? You are at high risk for heart disease.  Cancer screening Lung Cancer  Lung cancer screening is recommended for adults 55-80 years old who are at high risk for lung cancer because of a history of smoking.  A yearly low-dose CT scan of the lungs is recommended for people who: ? Currently smoke. ? Have quit within the past 15 years. ? Have at least a 30-pack-year history of smoking. A pack year is smoking an average of one pack of cigarettes a day for 1 year.  Yearly screening should continue until it has been 15 years since you quit.  Yearly screening should stop if you develop a health problem that would prevent you from having lung cancer treatment.  Breast Cancer  Practice breast self-awareness. This means understanding how your breasts normally appear and feel.  It also means doing regular breast self-exams. Let your health care provider know about any changes, no matter how small.  If you are in your 20s or 30s, you should have a clinical breast exam (CBE) by a health care provider every 1-3 years as part of a regular health exam.    If you are 40 or older, have a CBE every year. Also consider having a breast X-ray (mammogram) every year.  If you have a family history of breast cancer, talk to your health care provider about genetic screening.  If you are at high risk for breast cancer, talk to your health care provider about having an MRI and a mammogram every year.  Breast cancer gene (BRCA) assessment is recommended for women who have family members with BRCA-related cancers. BRCA-related cancers  include: ? Breast. ? Ovarian. ? Tubal. ? Peritoneal cancers.  Results of the assessment will determine the need for genetic counseling and BRCA1 and BRCA2 testing.  Cervical Cancer Your health care provider may recommend that you be screened regularly for cancer of the pelvic organs (ovaries, uterus, and vagina). This screening involves a pelvic examination, including checking for microscopic changes to the surface of your cervix (Pap test). You may be encouraged to have this screening done every 3 years, beginning at age 71.  For women ages 9-65, health care providers may recommend pelvic exams and Pap testing every 3 years, or they may recommend the Pap and pelvic exam, combined with testing for human papilloma virus (HPV), every 5 years. Some types of HPV increase your risk of cervical cancer. Testing for HPV may also be done on women of any age with unclear Pap test results.  Other health care providers may not recommend any screening for nonpregnant women who are considered low risk for pelvic cancer and who do not have symptoms. Ask your health care provider if a screening pelvic exam is right for you.  If you have had past treatment for cervical cancer or a condition that could lead to cancer, you need Pap tests and screening for cancer for at least 20 years after your treatment. If Pap tests have been discontinued, your risk factors (such as having a new sexual partner) need to be reassessed to determine if screening should resume. Some women have medical problems that increase the chance of getting cervical cancer. In these cases, your health care provider may recommend more frequent screening and Pap tests.  Colorectal Cancer  This type of cancer can be detected and often prevented.  Routine colorectal cancer screening usually begins at 67 years of age and continues through 68 years of age.  Your health care provider may recommend screening at an earlier age if you have risk factors  for colon cancer.  Your health care provider may also recommend using home test kits to check for hidden blood in the stool.  A small camera at the end of a tube can be used to examine your colon directly (sigmoidoscopy or colonoscopy). This is done to check for the earliest forms of colorectal cancer.  Routine screening usually begins at age 59.  Direct examination of the colon should be repeated every 5-10 years through 67 years of age. However, you may need to be screened more often if early forms of precancerous polyps or small growths are found.  Skin Cancer  Check your skin from head to toe regularly.  Tell your health care provider about any new moles or changes in moles, especially if there is a change in a mole's shape or color.  Also tell your health care provider if you have a mole that is larger than the size of a pencil eraser.  Always use sunscreen. Apply sunscreen liberally and repeatedly throughout the day.  Protect yourself by wearing long sleeves, pants, a wide-brimmed hat, and  sunglasses whenever you are outside.  Heart disease, diabetes, and high blood pressure  High blood pressure causes heart disease and increases the risk of stroke. High blood pressure is more likely to develop in: ? People who have blood pressure in the high end of the normal range (130-139/85-89 mm Hg). ? People who are overweight or obese. ? People who are African American.  If you are 28-98 years of age, have your blood pressure checked every 3-5 years. If you are 22 years of age or older, have your blood pressure checked every year. You should have your blood pressure measured twice-once when you are at a hospital or clinic, and once when you are not at a hospital or clinic. Record the average of the two measurements. To check your blood pressure when you are not at a hospital or clinic, you can use: ? An automated blood pressure machine at a pharmacy. ? A home blood pressure monitor.  If  you are between 14 years and 29 years old, ask your health care provider if you should take aspirin to prevent strokes.  Have regular diabetes screenings. This involves taking a blood sample to check your fasting blood sugar level. ? If you are at a normal weight and have a low risk for diabetes, have this test once every three years after 67 years of age. ? If you are overweight and have a high risk for diabetes, consider being tested at a younger age or more often. Preventing infection Hepatitis B  If you have a higher risk for hepatitis B, you should be screened for this virus. You are considered at high risk for hepatitis B if: ? You were born in a country where hepatitis B is common. Ask your health care provider which countries are considered high risk. ? Your parents were born in a high-risk country, and you have not been immunized against hepatitis B (hepatitis B vaccine). ? You have HIV or AIDS. ? You use needles to inject street drugs. ? You live with someone who has hepatitis B. ? You have had sex with someone who has hepatitis B. ? You get hemodialysis treatment. ? You take certain medicines for conditions, including cancer, organ transplantation, and autoimmune conditions.  Hepatitis C  Blood testing is recommended for: ? Everyone born from [birth year] through 1965. ? Anyone with known risk factors for hepatitis C.  Sexually transmitted infections (STIs)  You should be screened for sexually transmitted infections (STIs) including gonorrhea and chlamydia if: ? You are sexually active and are younger than 67 years of age. ? You are older than 67 years of age and your health care provider tells you that you are at risk for this type of infection. ? Your sexual activity has changed since you were last screened and you are at an increased risk for chlamydia or gonorrhea. Ask your health care provider if you are at risk.  If you do not have HIV, but are at risk, it may be recommended  that you take a prescription medicine daily to prevent HIV infection. This is called pre-exposure prophylaxis (PrEP). You are considered at risk if: ? You are sexually active and do not regularly use condoms or know the HIV status of your partner(s). ? You take drugs by injection. ? You are sexually active with a partner who has HIV.  Talk with your health care provider about whether you are at high risk of being infected with HIV. If you choose to begin PrEP, you  should first be tested for HIV. You should then be tested every 3 months for as long as you are taking PrEP. Pregnancy  If you are premenopausal and you may become pregnant, ask your health care provider about preconception counseling.  If you may become pregnant, take 400 to 800 micrograms (mcg) of folic acid every day.  If you want to prevent pregnancy, talk to your health care provider about birth control (contraception). Osteoporosis and menopause  Osteoporosis is a disease in which the bones lose minerals and strength with aging. This can result in serious bone fractures. Your risk for osteoporosis can be identified using a bone density scan.  If you are 65 years of age or older, or if you are at risk for osteoporosis and fractures, ask your health care provider if you should be screened.  Ask your health care provider whether you should take a calcium or vitamin D supplement to lower your risk for osteoporosis.  Menopause may have certain physical symptoms and risks.  Hormone replacement therapy may reduce some of these symptoms and risks. Talk to your health care provider about whether hormone replacement therapy is right for you. Follow these instructions at home:  Schedule regular health, dental, and eye exams.  Stay current with your immunizations.  Do not use any tobacco products including cigarettes, chewing tobacco, or electronic cigarettes.  If you are pregnant, do not drink alcohol.  If you are  breastfeeding, limit how much and how often you drink alcohol.  Limit alcohol intake to no more than 1 drink per day for nonpregnant women. One drink equals 12 ounces of beer, 5 ounces of wine, or 1 ounces of hard liquor.  Do not use street drugs.  Do not share needles.  Ask your health care provider for help if you need support or information about quitting drugs.  Tell your health care provider if you often feel depressed.  Tell your health care provider if you have ever been abused or do not feel safe at home. This information is not intended to replace advice given to you by your health care provider. Make sure you discuss any questions you have with your health care provider. Document Released: 08/07/2010 Document Revised: 06/30/2015 Document Reviewed: 10/26/2014 Elsevier Interactive Patient Education  2018 [employer]  

## 2017-04-18 NOTE — Patient Instructions (Signed)
Follow up in 6 months to recheck BP We'll notify you of your lab results and make any changes if needed Continue to work on healthy diet and regular exercise- you can do it! Call with any questions or concerns [place]!! 

## 2017-04-18 NOTE — Progress Notes (Signed)
   Subjective:    Patient ID: Mary Cohen, female    DOB: [birth year], 67 y.o.   MRN: [id number]  HPI CPE- UTD on colonoscopy, mammo and DEXA scheduled for today, Pneumovax given today.  No concerns today   Review of Systems Patient reports no vision/ hearing changes, adenopathy,fever, weight change,  persistant/recurrent hoarseness , swallowing issues, chest pain, palpitations, edema, persistant/recurrent cough, hemoptysis, dyspnea (rest/exertional/paroxysmal nocturnal), gastrointestinal bleeding (melena, rectal bleeding), abdominal pain, significant heartburn, bowel changes, GU symptoms (dysuria, hematuria, incontinence), Gyn symptoms (abnormal  bleeding, pain),  syncope, focal weakness, memory loss, numbness & tingling, skin/hair/nail changes, abnormal bruising or bleeding, anxiety, or depression.     Objective:   Physical Exam General Appearance:    Alert, cooperative, no distress, appears stated age  Head:    Normocephalic, without obvious abnormality, atraumatic  Eyes:    PERRL, conjunctiva/corneas clear, EOM's intact, fundi    benign, both eyes  Ears:    Normal TM's and external ear canals, both ears  Nose:   Nares normal, septum midline, mucosa normal, no drainage    or sinus tenderness  Throat:   Lips, mucosa, and tongue normal; teeth and gums normal  Neck:   Supple, symmetrical, trachea midline, no adenopathy;    Thyroid: no enlargement/tenderness/nodules  Back:     Symmetric, no curvature, ROM normal, no CVA tenderness  Lungs:     Clear to auscultation bilaterally, respirations unlabored  Chest Wall:    No tenderness or deformity   Heart:    Regular rate and rhythm, S1 and S2 normal, no murmur, rub   or gallop  Breast Exam:    Deferred to GYN  Abdomen:     Soft, non-tender, bowel sounds active all four quadrants,    no masses, no organomegaly  Genitalia:    Deferred to GYN  Rectal:    Extremities:   Extremities normal, atraumatic, no cyanosis or edema  Pulses:   2+  and symmetric all extremities  Skin:   Skin color, texture, turgor normal, no rashes or lesions  Lymph nodes:   Cervical, supraclavicular, and axillary nodes normal  Neurologic:   CNII-XII intact, normal strength, sensation and reflexes    throughout          Assessment & Plan:

## 2017-04-18 NOTE — Assessment & Plan Note (Signed)
Chronic problem.  Adequate control today w/ some degree of white coat elevation.  Asymptomatic.  Check labs.  No anticipated med changes.  Will follow.

## 2017-04-19 ENCOUNTER — Encounter: Payer: Self-pay | Admitting: General Practice

## 2017-05-02 ENCOUNTER — Other Ambulatory Visit: Payer: Self-pay | Admitting: Family Medicine

## 2017-07-28 ENCOUNTER — Other Ambulatory Visit: Payer: Self-pay | Admitting: Family Medicine

## 2017-10-22 ENCOUNTER — Ambulatory Visit: Payer: Medicare HMO | Admitting: Family Medicine

## 2017-10-22 DIAGNOSIS — Z0289 Encounter for other administrative examinations: Secondary | ICD-10-CM

## 2017-10-23 ENCOUNTER — Other Ambulatory Visit: Payer: Self-pay | Admitting: Family Medicine

## 2017-11-01 ENCOUNTER — Ambulatory Visit (INDEPENDENT_AMBULATORY_CARE_PROVIDER_SITE_OTHER): Payer: Medicare HMO | Admitting: General Practice

## 2017-11-01 DIAGNOSIS — Z23 Encounter for immunization: Secondary | ICD-10-CM | POA: Diagnosis not present

## 2017-11-01 NOTE — Progress Notes (Signed)
Mary Cohen is a 67 y.o. female presents to the office today for influenza injections, per physician's orders. Original order: 11/01/17 Fluzone (med), 0.24mL (dose),  IM (route) was administered LD (location) today. Patient tolerated injection.   Merle Cirelli L Aleysha Meckler

## 2017-11-13 ENCOUNTER — Other Ambulatory Visit: Payer: Self-pay | Admitting: Family Medicine

## 2018-02-09 ENCOUNTER — Other Ambulatory Visit: Payer: Self-pay | Admitting: Family Medicine

## 2018-02-22 ENCOUNTER — Other Ambulatory Visit: Payer: Self-pay | Admitting: Family Medicine

## 2018-03-06 ENCOUNTER — Other Ambulatory Visit (HOSPITAL_BASED_OUTPATIENT_CLINIC_OR_DEPARTMENT_OTHER): Payer: Self-pay | Admitting: Family Medicine

## 2018-03-06 DIAGNOSIS — Z1231 Encounter for screening mammogram for malignant neoplasm of breast: Secondary | ICD-10-CM

## 2018-04-21 ENCOUNTER — Other Ambulatory Visit: Payer: Self-pay

## 2018-04-21 ENCOUNTER — Ambulatory Visit (HOSPITAL_BASED_OUTPATIENT_CLINIC_OR_DEPARTMENT_OTHER)
Admission: RE | Admit: 2018-04-21 | Discharge: 2018-04-21 | Disposition: A | Discharge status: Home / Self Care | Payer: Medicare HMO | Source: Ambulatory Visit | Attending: Family Medicine | Admitting: Family Medicine

## 2018-04-21 DIAGNOSIS — Z1231 Encounter for screening mammogram for malignant neoplasm of breast: Secondary | ICD-10-CM | POA: Diagnosis not present

## 2018-04-23 ENCOUNTER — Other Ambulatory Visit: Payer: Self-pay | Admitting: Family Medicine

## 2018-04-23 DIAGNOSIS — R928 Other abnormal and inconclusive findings on diagnostic imaging of breast: Secondary | ICD-10-CM

## 2018-04-25 ENCOUNTER — Other Ambulatory Visit: Payer: Self-pay

## 2018-04-25 ENCOUNTER — Ambulatory Visit
Admission: RE | Admit: 2018-04-25 | Discharge: 2018-04-25 | Disposition: A | Discharge status: Home / Self Care | Payer: Medicare HMO | Source: Ambulatory Visit | Attending: Family Medicine | Admitting: Family Medicine

## 2018-04-25 DIAGNOSIS — R928 Other abnormal and inconclusive findings on diagnostic imaging of breast: Secondary | ICD-10-CM | POA: Diagnosis not present

## 2018-04-28 ENCOUNTER — Encounter: Payer: Medicare HMO | Admitting: Family Medicine

## 2018-04-29 ENCOUNTER — Encounter: Payer: Self-pay | Admitting: Family Medicine

## 2018-04-29 ENCOUNTER — Other Ambulatory Visit: Payer: Self-pay

## 2018-04-29 ENCOUNTER — Ambulatory Visit (INDEPENDENT_AMBULATORY_CARE_PROVIDER_SITE_OTHER): Payer: Medicare HMO | Admitting: Family Medicine

## 2018-04-29 VITALS — BP 109/65 | HR 45 | Ht 60.0 in | Wt 150.0 lb

## 2018-04-29 DIAGNOSIS — I10 Essential (primary) hypertension: Secondary | ICD-10-CM | POA: Diagnosis not present

## 2018-04-29 DIAGNOSIS — E039 Hypothyroidism, unspecified: Secondary | ICD-10-CM

## 2018-04-29 NOTE — Progress Notes (Signed)
I have discussed the procedure for the virtual visit with the patient who has given consent to proceed with assessment and treatment.   Jermany Sundell L Valerio Pinard, CMA     

## 2018-04-29 NOTE — Progress Notes (Signed)
Virtual Visit via Telephone Note  I connected with Mary Cohen on 04/29/18 at  8:45 AM EDT by telephone and verified that I am speaking with the correct person using two identifiers.   I discussed the limitations, risks, security and privacy concerns of performing an evaluation and management service by telephone and the availability of in person appointments. I also discussed with the patient that there may be a patient responsible charge related to this service. The patient expressed understanding and agreed to proceed.   History of Present Illness: HTN- chronic problem, on Atenolol Chlorthalidone 50/25mg  w/ excellent control.  Pt is down 6 lbs since last visit.  [facility].  No CP, SOB, HAs, visual changes, edema.  Hypothyroid- chronic problem, on Levothyroxine 47mcg w/ last TSH 2.48.  Denies excessive fatigue, changes to skin/hair/nails.   Observations/Objective: Home BP 109/65 Home pulse 45 Home weight 150 Pt is able to speak clearly, coherently without shortness of breath or increased work of breathing.  Thought process is linear.  Mood is appropriate.   Assessment and Plan: HTN- chronic problem, pt's home reading is excellent.  Asymptomatic.  Will arrange for future labs and schedule at pt's convenience  Hypothyroid- chronic problem.  Currently asymptomatic.  Check labs.  Adjust meds prn   Follow Up Instructions: Schedule future labs at pt's convenience   I discussed the assessment and treatment plan with the patient. The patient was provided an opportunity to ask questions and all were answered. The patient agreed with the plan and demonstrated an understanding of the instructions.   The patient was advised to call back or seek an in-person evaluation if the symptoms worsen or if the condition fails to improve as anticipated.  I provided 12 minutes of non-face-to-face time during this encounter.   Annye Asa,  MD

## 2018-05-05 ENCOUNTER — Other Ambulatory Visit: Payer: Self-pay | Admitting: Family Medicine

## 2018-05-08 ENCOUNTER — Other Ambulatory Visit (INDEPENDENT_AMBULATORY_CARE_PROVIDER_SITE_OTHER): Payer: Medicare HMO

## 2018-05-08 DIAGNOSIS — E039 Hypothyroidism, unspecified: Secondary | ICD-10-CM | POA: Diagnosis not present

## 2018-05-08 DIAGNOSIS — I10 Essential (primary) hypertension: Secondary | ICD-10-CM | POA: Diagnosis not present

## 2018-05-08 LAB — BASIC METABOLIC PANEL
BUN: 10 mg/dL (ref 6–23)
CO2: 35 mEq/L — ABNORMAL HIGH (ref 19–32)
Calcium: 10.6 mg/dL — ABNORMAL HIGH (ref 8.4–10.5)
Chloride: 102 mEq/L (ref 96–112)
Creatinine, Ser: 0.89 mg/dL (ref 0.40–1.20)
GFR: 63.13 mL/min (ref >60.00)
Glucose, Bld: 102 mg/dL — ABNORMAL HIGH (ref 70–99)
Potassium: 4.9 mEq/L (ref 3.5–5.1)
Sodium: 148 mEq/L — ABNORMAL HIGH (ref 135–145)

## 2018-05-08 LAB — LIPID PANEL
Cholesterol: 270 mg/dL — ABNORMAL HIGH (ref 0–200)
HDL: 54.5 mg/dL (ref >39.00)
NonHDL: 215.4
Total CHOL/HDL Ratio: 5
Triglycerides: 250 mg/dL — ABNORMAL HIGH (ref 0.0–149.0)
VLDL: 50 mg/dL — ABNORMAL HIGH (ref 0.0–40.0)

## 2018-05-08 LAB — LDL CHOLESTEROL, DIRECT: Direct LDL: 171 mg/dL

## 2018-05-08 LAB — HEPATIC FUNCTION PANEL
ALT: 25 U/L (ref 0–35)
AST: 26 U/L (ref 0–37)
Albumin: 4.5 g/dL (ref 3.5–5.2)
Alkaline Phosphatase: 63 U/L (ref 39–117)
Bilirubin, Direct: 0.1 mg/dL (ref 0.0–0.3)
Total Bilirubin: 0.7 mg/dL (ref 0.2–1.2)
Total Protein: 7.2 g/dL (ref 6.0–8.3)

## 2018-05-08 LAB — CBC WITH DIFFERENTIAL/PLATELET
Basophils Absolute: 0 10*3/uL (ref 0.0–0.1)
Basophils Relative: 0.2 % (ref 0.0–3.0)
Eosinophils Absolute: 0.1 10*3/uL (ref 0.0–0.7)
Eosinophils Relative: 1.1 % (ref 0.0–5.0)
HCT: 44.2 % (ref 36.0–46.0)
Hemoglobin: 15 g/dL (ref 12.0–15.0)
Lymphocytes Relative: 33.2 % (ref 12.0–46.0)
Lymphs Abs: 2.4 10*3/uL (ref 0.7–4.0)
MCHC: 33.9 g/dL (ref 30.0–36.0)
MCV: 96.5 fl (ref 78.0–100.0)
Monocytes Absolute: 0.8 10*3/uL (ref 0.1–1.0)
Monocytes Relative: 11 % (ref 3.0–12.0)
Neutro Abs: 4 10*3/uL (ref 1.4–7.7)
Neutrophils Relative %: 54.5 % (ref 43.0–77.0)
Platelets: 242 10*3/uL (ref 150.0–400.0)
RBC: 4.59 Mil/uL (ref 3.87–5.11)
RDW: 13.5 % (ref 11.5–15.5)
WBC: 7.4 10*3/uL (ref 4.0–10.5)

## 2018-05-08 LAB — TSH: TSH: 3.17 u[IU]/mL (ref 0.35–4.50)

## 2018-05-12 ENCOUNTER — Other Ambulatory Visit: Payer: Self-pay | Admitting: Family Medicine

## 2018-05-12 ENCOUNTER — Telehealth: Payer: Self-pay | Admitting: Family Medicine

## 2018-05-12 NOTE — Telephone Encounter (Signed)
Labs have been resulted but not reviewed by PCP. Waiting on notes.

## 2018-05-12 NOTE — Telephone Encounter (Signed)
Copied from [place] [id number]. Topic: Quick Communication - See Telephone Encounter >> May 12, 2018  9:56 AM Blase Mess A wrote: CRM for notification. See Telephone encounter for: 05/12/18.  Patient is calling to receive her lab results. Please advise. [phone] Jerilynn Mages)

## 2018-05-13 ENCOUNTER — Other Ambulatory Visit: Payer: Self-pay

## 2018-05-13 MED ORDER — ROSUVASTATIN CALCIUM 20 MG PO TABS: 20.0000 mg | ORAL_TABLET | Freq: Every day | ORAL | 1 refills | Status: DC

## 2018-05-14 ENCOUNTER — Other Ambulatory Visit: Payer: Self-pay

## 2018-05-14 DIAGNOSIS — E7800 Pure hypercholesterolemia, unspecified: Secondary | ICD-10-CM

## 2018-06-04 ENCOUNTER — Other Ambulatory Visit: Payer: Self-pay | Admitting: Family Medicine

## 2018-06-19 ENCOUNTER — Encounter: Payer: Medicare HMO | Admitting: Family Medicine

## 2018-07-28 ENCOUNTER — Other Ambulatory Visit: Payer: Self-pay | Admitting: Family Medicine

## 2018-08-03 ENCOUNTER — Other Ambulatory Visit: Payer: Self-pay | Admitting: Family Medicine

## 2018-08-27 ENCOUNTER — Other Ambulatory Visit: Payer: Self-pay | Admitting: Family Medicine

## 2018-09-19 ENCOUNTER — Encounter: Payer: Self-pay | Admitting: Family Medicine

## 2018-09-19 ENCOUNTER — Other Ambulatory Visit: Payer: Self-pay

## 2018-09-19 ENCOUNTER — Ambulatory Visit (INDEPENDENT_AMBULATORY_CARE_PROVIDER_SITE_OTHER): Payer: Medicare HMO | Admitting: Family Medicine

## 2018-09-19 VITALS — BP 122/78 | HR 55 | Temp 97.8°F | Resp 16 | Ht 60.0 in | Wt 160.4 lb

## 2018-09-19 DIAGNOSIS — E669 Obesity, unspecified: Secondary | ICD-10-CM | POA: Insufficient documentation

## 2018-09-19 DIAGNOSIS — E785 Hyperlipidemia, unspecified: Secondary | ICD-10-CM | POA: Diagnosis not present

## 2018-09-19 DIAGNOSIS — Z0000 Encounter for general adult medical examination without abnormal findings: Secondary | ICD-10-CM

## 2018-09-19 LAB — LIPID PANEL
Cholesterol: 164 mg/dL (ref 0–200)
HDL: 55.9 mg/dL (ref >39.00)
LDL Cholesterol: 76 mg/dL (ref 0–99)
NonHDL: 108.46
Total CHOL/HDL Ratio: 3
Triglycerides: 164 mg/dL — ABNORMAL HIGH (ref 0.0–149.0)
VLDL: 32.8 mg/dL (ref 0.0–40.0)

## 2018-09-19 LAB — HEPATIC FUNCTION PANEL
ALT: 42 U/L — ABNORMAL HIGH (ref 0–35)
AST: 40 U/L — ABNORMAL HIGH (ref 0–37)
Albumin: 4.5 g/dL (ref 3.5–5.2)
Alkaline Phosphatase: 62 U/L (ref 39–117)
Bilirubin, Direct: 0.2 mg/dL (ref 0.0–0.3)
Total Bilirubin: 0.7 mg/dL (ref 0.2–1.2)
Total Protein: 7.1 g/dL (ref 6.0–8.3)

## 2018-09-19 LAB — CBC WITH DIFFERENTIAL/PLATELET
Basophils Absolute: 0 10*3/uL (ref 0.0–0.1)
Basophils Relative: 0.2 % (ref 0.0–3.0)
Eosinophils Absolute: 0.1 10*3/uL (ref 0.0–0.7)
Eosinophils Relative: 0.8 % (ref 0.0–5.0)
HCT: 42.1 % (ref 36.0–46.0)
Hemoglobin: 14.5 g/dL (ref 12.0–15.0)
Lymphocytes Relative: 28.2 % (ref 12.0–46.0)
Lymphs Abs: 2 10*3/uL (ref 0.7–4.0)
MCHC: 34.3 g/dL (ref 30.0–36.0)
MCV: 94.2 fl (ref 78.0–100.0)
Monocytes Absolute: 0.7 10*3/uL (ref 0.1–1.0)
Monocytes Relative: 9.4 % (ref 3.0–12.0)
Neutro Abs: 4.4 10*3/uL (ref 1.4–7.7)
Neutrophils Relative %: 61.4 % (ref 43.0–77.0)
Platelets: 230 10*3/uL (ref 150.0–400.0)
RBC: 4.47 Mil/uL (ref 3.87–5.11)
RDW: 13.6 % (ref 11.5–15.5)
WBC: 7.2 10*3/uL (ref 4.0–10.5)

## 2018-09-19 LAB — BASIC METABOLIC PANEL
BUN: 11 mg/dL (ref 6–23)
CO2: 31 mEq/L (ref 19–32)
Calcium: 9.8 mg/dL (ref 8.4–10.5)
Chloride: 98 mEq/L (ref 96–112)
Creatinine, Ser: 0.73 mg/dL (ref 0.40–1.20)
GFR: 79.27 mL/min (ref >60.00)
Glucose, Bld: 88 mg/dL (ref 70–99)
Potassium: 3.5 mEq/L (ref 3.5–5.1)
Sodium: 139 mEq/L (ref 135–145)

## 2018-09-19 LAB — TSH: TSH: 1.64 u[IU]/mL (ref 0.35–4.50)

## 2018-09-19 NOTE — Assessment & Plan Note (Signed)
Pt's PE unchanged from previous.  UTD on mammo, colonoscopy, immunizations.  Check labs.  Anticipatory guidance provided.  

## 2018-09-19 NOTE — Assessment & Plan Note (Signed)
Pt has gained 10 lbs since last visit.  Check labs to risk stratify.  Will follow.

## 2018-09-19 NOTE — Patient Instructions (Signed)
Follow up in 6 months to recheck BP and cholesterol We'll notify you of your lab results and make any changes if needed Keep up the good work on healthy diet and regular exercise- you can do it! Call with any questions or concerns Stay Safe!!!

## 2018-09-19 NOTE — Assessment & Plan Note (Signed)
Chronic problem.  Tolerating statin w/o difficulty.  Check labs.  Adjust meds prn  

## 2018-09-19 NOTE — Progress Notes (Signed)
   Subjective:    Patient ID: Mary Cohen, female    DOB: [birth year], 68 y.o.   MRN: [id number]  HPI CPE- UTD on mammo, colonoscopy, immunizations.  Pt has gained 10 lbs since last visit.   Review of Systems Patient reports no vision/ hearing changes, adenopathy,fever, persistant/recurrent hoarseness , swallowing issues, chest pain, palpitations, edema, persistant/recurrent cough, hemoptysis, dyspnea (rest/exertional/paroxysmal nocturnal), gastrointestinal bleeding (melena, rectal bleeding), abdominal pain, significant heartburn, bowel changes, GU symptoms (dysuria, hematuria, incontinence), Gyn symptoms (abnormal  bleeding, pain),  syncope, focal weakness, memory loss, numbness & tingling, skin/hair/nail changes, abnormal bruising or bleeding, anxiety, or depression.   + weight gain    Objective:   Physical Exam General Appearance:    Alert, cooperative, no distress, appears stated age  Head:    Normocephalic, without obvious abnormality, atraumatic  Eyes:    PERRL, conjunctiva/corneas clear, EOM's intact, fundi    benign, both eyes  Ears:    Normal TM's and external ear canals, both ears  Nose:   Deferred due to COVID  Throat:   Neck:   Supple, symmetrical, trachea midline, no adenopathy;    Thyroid: no enlargement/tenderness/nodules  Back:     Symmetric, no curvature, ROM normal, no CVA tenderness  Lungs:     Clear to auscultation bilaterally, respirations unlabored  Chest Wall:    No tenderness or deformity   Heart:    Regular rate and rhythm, S1 and S2 normal, no murmur, rub   or gallop  Breast Exam:    Deferred to mammo  Abdomen:     Soft, non-tender, bowel sounds active all four quadrants,    no masses, no organomegaly  Genitalia:    Deferred  Rectal:    Extremities:   Extremities normal, atraumatic, no cyanosis or edema  Pulses:   2+ and symmetric all extremities  Skin:   Skin color, texture, turgor normal, no rashes or lesions  Lymph nodes:   Cervical,  supraclavicular, and axillary nodes normal  Neurologic:   CNII-XII intact, normal strength, sensation and reflexes    throughout          Assessment & Plan:

## 2018-10-22 ENCOUNTER — Other Ambulatory Visit: Payer: Self-pay | Admitting: Family Medicine

## 2018-10-27 ENCOUNTER — Other Ambulatory Visit: Payer: Self-pay | Admitting: Family Medicine

## 2018-11-19 ENCOUNTER — Other Ambulatory Visit: Payer: Self-pay | Admitting: Family Medicine

## 2018-12-09 DIAGNOSIS — R69 Illness, unspecified: Secondary | ICD-10-CM | POA: Diagnosis not present

## 2019-01-20 ENCOUNTER — Other Ambulatory Visit: Payer: Self-pay | Admitting: Physician Assistant

## 2019-01-23 ENCOUNTER — Other Ambulatory Visit: Payer: Self-pay | Admitting: Family Medicine

## 2019-02-10 ENCOUNTER — Other Ambulatory Visit: Payer: Self-pay | Admitting: Family Medicine

## 2019-03-05 ENCOUNTER — Ambulatory Visit: Payer: Medicare HMO

## 2019-03-14 ENCOUNTER — Ambulatory Visit: Payer: Medicare HMO

## 2019-03-23 ENCOUNTER — Other Ambulatory Visit: Payer: Self-pay | Admitting: Family Medicine

## 2019-03-23 DIAGNOSIS — Z1231 Encounter for screening mammogram for malignant neoplasm of breast: Secondary | ICD-10-CM

## 2019-03-25 ENCOUNTER — Ambulatory Visit (INDEPENDENT_AMBULATORY_CARE_PROVIDER_SITE_OTHER): Payer: Medicare HMO | Admitting: Family Medicine

## 2019-03-25 ENCOUNTER — Ambulatory Visit (INDEPENDENT_AMBULATORY_CARE_PROVIDER_SITE_OTHER): Payer: Medicare HMO

## 2019-03-25 ENCOUNTER — Encounter: Payer: Self-pay | Admitting: Family Medicine

## 2019-03-25 ENCOUNTER — Other Ambulatory Visit: Payer: Self-pay

## 2019-03-25 VITALS — BP 130/68 | HR 48 | Temp 97.4°F | Ht 60.0 in | Wt 157.8 lb

## 2019-03-25 VITALS — BP 130/68 | HR 48 | Ht 60.0 in | Wt 157.8 lb

## 2019-03-25 DIAGNOSIS — I10 Essential (primary) hypertension: Secondary | ICD-10-CM | POA: Diagnosis not present

## 2019-03-25 DIAGNOSIS — E039 Hypothyroidism, unspecified: Secondary | ICD-10-CM

## 2019-03-25 DIAGNOSIS — Z1211 Encounter for screening for malignant neoplasm of colon: Secondary | ICD-10-CM | POA: Diagnosis not present

## 2019-03-25 DIAGNOSIS — E669 Obesity, unspecified: Secondary | ICD-10-CM | POA: Diagnosis not present

## 2019-03-25 DIAGNOSIS — E785 Hyperlipidemia, unspecified: Secondary | ICD-10-CM

## 2019-03-25 DIAGNOSIS — Z0000 Encounter for general adult medical examination without abnormal findings: Secondary | ICD-10-CM | POA: Diagnosis not present

## 2019-03-25 LAB — HEPATIC FUNCTION PANEL
ALT: 35 U/L (ref 0–35)
AST: 33 U/L (ref 0–37)
Albumin: 4.4 g/dL (ref 3.5–5.2)
Alkaline Phosphatase: 68 U/L (ref 39–117)
Bilirubin, Direct: 0.2 mg/dL (ref 0.0–0.3)
Total Bilirubin: 0.9 mg/dL (ref 0.2–1.2)
Total Protein: 7.1 g/dL (ref 6.0–8.3)

## 2019-03-25 LAB — CBC WITH DIFFERENTIAL/PLATELET
Basophils Absolute: 0 10*3/uL (ref 0.0–0.1)
Basophils Relative: 0.3 % (ref 0.0–3.0)
Eosinophils Absolute: 0.1 10*3/uL (ref 0.0–0.7)
Eosinophils Relative: 1.9 % (ref 0.0–5.0)
HCT: 42.6 % (ref 36.0–46.0)
Hemoglobin: 14.5 g/dL (ref 12.0–15.0)
Lymphocytes Relative: 25.6 % (ref 12.0–46.0)
Lymphs Abs: 1.8 10*3/uL (ref 0.7–4.0)
MCHC: 34 g/dL (ref 30.0–36.0)
MCV: 93.2 fl (ref 78.0–100.0)
Monocytes Absolute: 0.7 10*3/uL (ref 0.1–1.0)
Monocytes Relative: 10.1 % (ref 3.0–12.0)
Neutro Abs: 4.5 10*3/uL (ref 1.4–7.7)
Neutrophils Relative %: 62.1 % (ref 43.0–77.0)
Platelets: 211 10*3/uL (ref 150.0–400.0)
RBC: 4.57 Mil/uL (ref 3.87–5.11)
RDW: 13.8 % (ref 11.5–15.5)
WBC: 7.2 10*3/uL (ref 4.0–10.5)

## 2019-03-25 LAB — BASIC METABOLIC PANEL
BUN: 13 mg/dL (ref 6–23)
CO2: 33 mEq/L — ABNORMAL HIGH (ref 19–32)
Calcium: 9.9 mg/dL (ref 8.4–10.5)
Chloride: 100 mEq/L (ref 96–112)
Creatinine, Ser: 0.72 mg/dL (ref 0.40–1.20)
GFR: 80.42 mL/min (ref >60.00)
Glucose, Bld: 101 mg/dL — ABNORMAL HIGH (ref 70–99)
Potassium: 3.9 mEq/L (ref 3.5–5.1)
Sodium: 140 mEq/L (ref 135–145)

## 2019-03-25 LAB — LIPID PANEL
Cholesterol: 145 mg/dL (ref 0–200)
HDL: 53.1 mg/dL (ref >39.00)
LDL Cholesterol: 62 mg/dL (ref 0–99)
NonHDL: 91.68
Total CHOL/HDL Ratio: 3
Triglycerides: 149 mg/dL (ref 0.0–149.0)
VLDL: 29.8 mg/dL (ref 0.0–40.0)

## 2019-03-25 LAB — TSH: TSH: 2.38 u[IU]/mL (ref 0.35–4.50)

## 2019-03-25 NOTE — Assessment & Plan Note (Signed)
Chronic problem.  Currently asymptomatic.  Check labs.  Adjust meds prn  

## 2019-03-25 NOTE — Progress Notes (Signed)
Subjective:   Mary Cohen is a 69 y.o. female who presents for Medicare Annual (Subsequent) preventive examination.  Review of Systems:   Cardiac Risk Factors include: advanced age (>15men, >54 women);hypertension;dyslipidemia    Objective:     Vitals: BP 130/68   Pulse (!) 48   Ht 5' (1.524 m)   Wt 157 lb 12.8 oz (71.6 kg)   SpO2 96%   BMI 30.82 kg/m   Body mass index is 30.82 kg/m.  Advanced Directives 03/25/2019 04/18/2017 04/12/2016  Does Patient Have a Medical Advance Directive? No No No  Would patient like information on creating a medical advance directive? Yes (MAU/Ambulatory/Procedural Areas - Information given) Yes (MAU/Ambulatory/Procedural Areas - Information given) Yes (MAU/Ambulatory/Procedural Areas - Information given)    Tobacco Social History   Tobacco Use  Smoking Status Never Smoker  Smokeless Tobacco Never Used     Counseling given: Not Answered   Clinical Intake:  Pre-visit preparation completed: Yes  Pain : No/denies pain  Diabetes: No  How often do you need to have someone help you when you read instructions, pamphlets, or other written materials from your doctor or pharmacy?: 1 - Never  Interpreter Needed?: No  Information entered by :: Denman George LPN  Past Medical History:  Diagnosis Date  . Fibroid 1995  . Hypertension 4/96  . Ovarian mass 04/1998   right ovarian mass  . Thyroid disease 03/1993   hypothyroidism  . Tremors of nervous system    Past Surgical History:  Procedure Laterality Date  . ABDOMINAL HYSTERECTOMY  07/1996  . BILATERAL SALPINGOOPHORECTOMY  05/1998   parasitic fibroid  . COLONOSCOPY  11/2006   negative, recheck in 10 years  . TONSILLECTOMY AND ADENOIDECTOMY  3rd grade  . TUBAL LIGATION Bilateral 1985  . VAGINAL DELIVERY  07/1996   secondary to fibroids and adenomyosis, ovaries remain   Family History  Problem Relation Age of Onset  . Hypertension Mother   . Diabetes Mother   .  Hyperlipidemia Mother   . Atrial fibrillation Mother   . Hypertension Father   . Stroke Father   . Heart disease Father        A fib, CABG in past  . Thyroid disease Sister    Social History   Socioeconomic History  . Marital status: Married    Spouse name: Not on file  . Number of children: Not on file  . Years of education: Not on file  . Highest education level: Not on file  Occupational History  . Not on file  Tobacco Use  . Smoking status: Never Smoker  . Smokeless tobacco: Never Used  Substance and Sexual Activity  . Alcohol use: Yes    Alcohol/week: 3.0 standard drinks    Types: 3 Standard drinks or equivalent per week  . Drug use: No  . Sexual activity: Yes    Birth control/protection: Surgical    Comment: hysterectomy  Other Topics Concern  . Not on file  Social History Narrative  . Not on file   Social Determinants of Health   Financial Resource Strain:   . Difficulty of Paying Living Expenses: Not on file  Food Insecurity:   . Worried About Charity fundraiser in the Last Year: Not on file  . Ran Out of Food in the Last Year: Not on file  Transportation Needs:   . Lack of Transportation (Medical): Not on file  . Lack of Transportation (Non-Medical): Not on file  Physical Activity:   .  Days of Exercise per Week: Not on file  . Minutes of Exercise per Session: Not on file  Stress:   . Feeling of Stress : Not on file  Social Connections:   . Frequency of Communication with Friends and Family: Not on file  . Frequency of Social Gatherings with Friends and Family: Not on file  . Attends Religious Services: Not on file  . Active Member of Clubs or Organizations: Not on file  . Attends Archivist Meetings: Not on file  . Marital Status: Not on file    Outpatient Encounter Medications as of 03/25/2019  Medication Sig  . atenolol-chlorthalidone (TENORETIC) 50-25 MG tablet TAKE 1 TABLET BY MOUTH EVERY DAY  . Cholecalciferol (VITAMIN D3) 10000  UNITS capsule Take 10,000 Units by mouth once a week.  . levothyroxine (SYNTHROID) 25 MCG tablet TAKE 1 TABLET BY MOUTH EVERY DAY BEFORE BREAKFAST  . Multiple Vitamin (MULTIVITAMIN) tablet Take 1 tablet by mouth daily.  . rosuvastatin (CRESTOR) 20 MG tablet TAKE 1 TABLET BY MOUTH EVERY DAY AT NIGHT  . vitamin C (ASCORBIC ACID) 500 MG tablet Take 500 mg by mouth daily.  . [DISCONTINUED] calcium carbonate 1250 MG capsule Take 1,250 mg by mouth 2 (two) times daily with a meal.   No facility-administered encounter medications on file as of 03/25/2019.    Activities of Daily Living In your present state of health, do you have any difficulty performing the following activities: 03/25/2019 09/19/2018  Hearing? N N  Vision? N N  Difficulty concentrating or making decisions? N N  Walking or climbing stairs? N N  Dressing or bathing? N N  Doing errands, shopping? N N  Preparing Food and eating ? N -  Using the Toilet? N -  In the past six months, have you accidently leaked urine? N -  Do you have problems with loss of bowel control? N -  Managing your Medications? N -  Managing your Finances? N -  Housekeeping or managing your Housekeeping? N -  Some recent data might be hidden    Patient Care Team: Midge Minium, MD as PCP - General (Family Medicine) Delaney Meigs, MD as Consulting Physician (Gastroenterology)    Assessment:   This is a routine wellness examination for [place].  Exercise Activities and Dietary recommendations Current Exercise Habits: The patient does not participate in regular exercise at present  Goals    . Weight (lb) < 140 lb (63.5 kg)     Lose weight by increasing activity.        Fall Risk Fall Risk  03/25/2019 09/19/2018 04/29/2018 04/18/2017 04/12/2016  Falls in the past year? 0 0 0 No No  Number falls in past yr: 0 0 0 - -  Injury with Fall? 0 0 0 - -  Follow up Falls evaluation completed;Education provided;Falls prevention discussed - - - -   Is the  patient's home free of loose throw rugs in walkways, pet beds, electrical cords, etc?   yes      Grab bars in the bathroom? yes      Handrails on the stairs?   yes      Adequate lighting?   yes  Timed Get Up and Go performed: completed and within normal timeframe; no gait abnormalities noted   Depression Screen PHQ 2/9 Scores 03/25/2019 09/19/2018 04/29/2018 04/18/2017  PHQ - 2 Score 0 0 0 0  PHQ- 9 Score - 0 0 -  Exception Documentation - - - -  Cognitive Function        Immunization History  Administered Date(s) Administered  . DTaP 08/13/2011  . Influenza, High Dose Seasonal PF 11/01/2017, 12/09/2018  . Influenza,inj,Quad PF,6+ Mos 10/18/2015, 10/15/2016  . Influenza-Unspecified 11/06/2014  . Moderna SARS-COVID-2 Vaccination 03/09/2019  . Pneumococcal Conjugate-13 04/12/2016  . Pneumococcal Polysaccharide-23 04/18/2017  . Td 02/05/2013    Qualifies for Shingles Vaccine?Discussed and patient will check with pharmacy for coverage.  Patient education handout provided   Screening Tests Health Maintenance  Topic Date Due  . COLONOSCOPY  02/06/2019  . MAMMOGRAM  04/20/2020  . TETANUS/TDAP  02/06/2023  . INFLUENZA VACCINE  Completed  . DEXA SCAN  Completed  . Hepatitis C Screening  Completed  . PNA vac Low Risk Adult  Completed    Cancer Screenings: Lung: Low Dose CT Chest recommended if Age 26-80 years, 30 pack-year currently smoking OR have quit w/in 15years. Patient does not qualify. Breast:  Up to date on Mammogram? Yes   Up to date of Bone Density/Dexa? Yes Colorectal: Cologuard ordered today     Plan:  I have personally reviewed and addressed the Medicare Annual Wellness questionnaire and have noted the following in the patient's chart:  A. Medical and social history B. Use of alcohol, tobacco or illicit drugs  C. Current medications and supplements D. Functional ability and status E.  Nutritional status F.  Physical activity G. Advance directives H. List  of other physicians I.  Hospitalizations, surgeries, and ER visits in previous 12 months J.  [place] such as hearing and vision if needed, cognitive and depression L. Referrals, records requested, and appointments- Cologuard ordered   In addition, I have reviewed and discussed with patient certain preventive protocols, quality metrics, and best practice recommendations. A written personalized care plan for preventive services as well as general preventive health recommendations were provided to patient.   Signed,  Denman George, LPN  Nurse Health Advisor   Nurse Notes: Fyi-Patient has had 1st Covid vaccine

## 2019-03-25 NOTE — Progress Notes (Signed)
   Subjective:    Patient ID: Mary Cohen, female    DOB: 05-09-1950, 69 y.o.   MRN: [id number]  HPI HTN- chronic problem, on Atenolol/Chorthalidone daily w/ adequate control.  Pt reports home BPs are even lower.  No CP, SOB, HAs, visual changes, edema.  Hyperlipidemia- chronic problem, on Crestor 20mg  daily.  No abd pain, N/V.  Hypothyroid- chronic problem, on Levothyroxine 60mcg daily.  No changes to skin/hair/nails.  Energy level is good  Obesity- pt's BMI is 30.82  Pt gets ~10,000 steps/day  Pt has had 1st COVID vaccine  Review of Systems For ROS see HPI   This visit occurred during the SARS-CoV-2 public health emergency.  Safety protocols were in place, including screening questions prior to the visit, additional usage of staff PPE, and extensive cleaning of exam room while observing appropriate contact time as indicated for disinfecting solutions.       Objective:   Physical Exam Vitals reviewed.  Constitutional:      General: She is not in acute distress.    Appearance: She is well-developed.  HENT:     Head: Normocephalic and atraumatic.  Eyes:     Conjunctiva/sclera: Conjunctivae normal.     Pupils: Pupils are equal, round, and reactive to light.  Neck:     Thyroid: No thyromegaly.  Cardiovascular:     Rate and Rhythm: Normal rate and regular rhythm.     Heart sounds: Normal heart sounds. No murmur.  Pulmonary:     Effort: Pulmonary effort is normal. No respiratory distress.     Breath sounds: Normal breath sounds.  Abdominal:     General: There is no distension.     Palpations: Abdomen is soft.     Tenderness: There is no abdominal tenderness.  Musculoskeletal:     Cervical back: Normal range of motion and neck supple.  Lymphadenopathy:     Cervical: No cervical adenopathy.  Skin:    General: Skin is warm and dry.  Neurological:     Mental Status: She is alert and oriented to person, place, and time.  Psychiatric:        Behavior: Behavior  normal.           Assessment & Plan:

## 2019-03-25 NOTE — Assessment & Plan Note (Signed)
Chronic problem.  Tolerating statin w/o difficulty.  Check labs.  Adjust meds prn  

## 2019-03-25 NOTE — Patient Instructions (Signed)
Schedule your complete physical in 6 months We'll notify you of your lab results and make any changes if needed Continue to work on healthy diet and regular exercise- you're doing great! Call with any questions or concerns Stay Safe!  Stay Healthy! 

## 2019-03-25 NOTE — Patient Instructions (Addendum)
Mary Cohen , Thank you for taking time to come for your Medicare Wellness Visit. I appreciate your ongoing commitment to your health goals. Please review the following plan we discussed and let me know if I can assist you in the future.   Screening recommendations/referrals: Colorectal Screening:recommended;  see information on Cologuard Mammogram: up to date; scheduled for  Bone Density: up to date; last 04/18/17  Vision and Dental Exams: Recommended annual ophthalmology exams for early detection of glaucoma and other disorders of the eye Recommended annual dental exams for proper oral hygiene  Vaccinations: Influenza vaccine: completed 12/09/18  Pneumococcal vaccine: up to date; last 04/18/17 Tdap vaccine: up to date; last 02/2013 Shingles vaccine: Please call your insurance company to determine your out of pocket expense for the Shingrix vaccine. You may receive this vaccine at your local pharmacy. (see handout)   Advanced directives: Advance directives discussed with you today. I have provided a copy for you to complete at home and have notarized. Once this is complete please bring a copy in to our office so we can scan it into your chart.  Goals: Recommend to drink at least 6-8 8oz glasses of water per day and consume a balanced diet rich in fresh fruits and vegetables.   Next appointment: Please schedule your Annual Wellness Visit with your Nurse Health Advisor in one year.  Preventive Care 69 Years and Older, Female Preventive care refers to lifestyle choices and visits with your health care provider that can promote health and wellness. What does preventive care include?  A yearly physical exam. This is also called an annual well check.  Dental exams once or twice a year.  Routine eye exams. Ask your health care provider how often you should have your eyes checked.  Personal lifestyle choices, including:  Daily care of your teeth and gums.  Regular physical  activity.  Eating a healthy diet.  Avoiding tobacco and drug use.  Limiting alcohol use.  Practicing safe sex.  Taking low-dose aspirin every day if recommended by your health care provider.  Taking vitamin and mineral supplements as recommended by your health care provider. What happens during an annual well check? The services and screenings done by your health care provider during your annual well check will depend on your age, overall health, lifestyle risk factors, and family history of disease. Counseling  Your health care provider may ask you questions about your:  Alcohol use.  Tobacco use.  Drug use.  Emotional well-being.  Home and relationship well-being.  Sexual activity.  Eating habits.  History of falls.  Memory and ability to understand (cognition).  Work and work Statistician.  Reproductive health. Screening  You may have the following tests or measurements:  Height, weight, and BMI.  Blood pressure.  Lipid and cholesterol levels. These may be checked every 5 years, or more frequently if you are over 30 years old.  Skin check.  Lung cancer screening. You may have this screening every year starting at age 69 if you have a 30-pack-year history of smoking and currently smoke or have quit within the past 15 years.  Fecal occult blood test (FOBT) of the stool. You may have this test every year starting at age 69.  Flexible sigmoidoscopy or colonoscopy. You may have a sigmoidoscopy every 5 years or a colonoscopy every 10 years starting at age 69.  Hepatitis C blood test.  Hepatitis B blood test.  Sexually transmitted disease (STD) testing.  Diabetes screening. This is done by  checking your blood sugar (glucose) after you have not eaten for a while (fasting). You may have this done every 1-3 years.  Bone density scan. This is done to screen for osteoporosis. You may have this done starting at age 69.  Mammogram. This may be done every 1-2  years. Talk to your health care provider about how often you should have regular mammograms. Talk with your health care provider about your test results, treatment options, and if necessary, the need for more tests. Vaccines  Your health care provider may recommend certain vaccines, such as:  Influenza vaccine. This is recommended every year.  Tetanus, diphtheria, and acellular pertussis (Tdap, Td) vaccine. You may need a Td booster every 10 years.  Zoster vaccine. You may need this after age 69.  Pneumococcal 13-valent conjugate (PCV13) vaccine. One dose is recommended after age 69.  Pneumococcal polysaccharide (PPSV23) vaccine. One dose is recommended after age 69. Talk to your health care provider about which screenings and vaccines you need and how often you need them. This information is not intended to replace advice given to you by your health care provider. Make sure you discuss any questions you have with your health care provider. Document Released: 02/18/2015 Document Revised: 10/12/2015 Document Reviewed: 11/23/2014 Elsevier Interactive Patient Education  2017 [place] Prevention in the Home Falls can cause injuries. They can happen to people of all ages. There are many things you can do to make your home safe and to help prevent falls. What can I do on the outside of my home?  Regularly fix the edges of walkways and driveways and fix any cracks.  Remove anything that might make you trip as you walk through a door, such as a raised step or threshold.  Trim any bushes or trees on the path to your home.  Use bright outdoor lighting.  Clear any walking paths of anything that might make someone trip, such as rocks or tools.  Regularly check to see if handrails are loose or broken. Make sure that both sides of any steps have handrails.  Any raised decks and porches should have guardrails on the edges.  Have any leaves, snow, or ice cleared regularly.  Use  sand or salt on walking paths during winter.  Clean up any spills in your garage right away. This includes oil or grease spills. What can I do in the bathroom?  Use night lights.  Install grab bars by the toilet and in the tub and shower. Do not use towel bars as grab bars.  Use non-skid mats or decals in the tub or shower.  If you need to sit down in the shower, use a plastic, non-slip stool.  Keep the floor dry. Clean up any water that spills on the floor as soon as it happens.  Remove soap buildup in the tub or shower regularly.  Attach bath mats securely with double-sided non-slip rug tape.  Do not have throw rugs and other things on the floor that can make you trip. What can I do in the bedroom?  Use night lights.  Make sure that you have a light by your bed that is easy to reach.  Do not use any sheets or blankets that are too big for your bed. They should not hang down onto the floor.  Have a firm chair that has side arms. You can use this for support while you get dressed.  Do not have throw rugs and other things on the  floor that can make you trip. What can I do in the kitchen?  Clean up any spills right away.  Avoid walking on wet floors.  Keep items that you use a lot in easy-to-reach places.  If you need to reach something above you, use a strong step stool that has a grab bar.  Keep electrical cords out of the way.  Do not use floor polish or wax that makes floors slippery. If you must use wax, use non-skid floor wax.  Do not have throw rugs and other things on the floor that can make you trip. What can I do with my stairs?  Do not leave any items on the stairs.  Make sure that there are handrails on both sides of the stairs and use them. Fix handrails that are broken or loose. Make sure that handrails are as long as the stairways.  Check any carpeting to make sure that it is firmly attached to the stairs. Fix any carpet that is loose or worn.  Avoid  having throw rugs at the top or bottom of the stairs. If you do have throw rugs, attach them to the floor with carpet tape.  Make sure that you have a light switch at the top of the stairs and the bottom of the stairs. If you do not have them, ask someone to add them for you. What else can I do to help prevent falls?  Wear shoes that:  Do not have high heels.  Have rubber bottoms.  Are comfortable and fit you well.  Are closed at the toe. Do not wear sandals.  If you use a stepladder:  Make sure that it is fully opened. Do not climb a closed stepladder.  Make sure that both sides of the stepladder are locked into place.  Ask someone to hold it for you, if possible.  Clearly mark and make sure that you can see:  Any grab bars or handrails.  First and last steps.  Where the edge of each step is.  Use tools that help you move around (mobility aids) if they are needed. These include:  Canes.  Walkers.  Scooters.  Crutches.  Turn on the lights when you go into a dark area. Replace any light bulbs as soon as they burn out.  Set up your furniture so you have a clear path. Avoid moving your furniture around.  If any of your floors are uneven, fix them.  If there are any pets around you, be aware of where they are.  Review your medicines with your doctor. Some medicines can make you feel dizzy. This can increase your chance of falling. Ask your doctor what other things that you can do to help prevent falls. This information is not intended to replace advice given to you by your health care provider. Make sure you discuss any questions you have with your health care provider. Document Released: 11/18/2008 Document Revised: 06/30/2015 Document Reviewed: 02/26/2014 Elsevier Interactive Patient Education  2017 [employer].

## 2019-03-25 NOTE — Assessment & Plan Note (Signed)
Chronic problem.  Adequate control today.  Asymptomatic.  Check labs.  No anticipated med changes.  Will follow. 

## 2019-03-25 NOTE — Assessment & Plan Note (Signed)
Ongoing issue for pt.  Stressed need for healthy diet and regular exercise.  Check labs to risk stratify.  Will follow 

## 2019-04-15 ENCOUNTER — Other Ambulatory Visit: Payer: Self-pay | Admitting: Family Medicine

## 2019-04-16 DIAGNOSIS — Z1211 Encounter for screening for malignant neoplasm of colon: Secondary | ICD-10-CM | POA: Diagnosis not present

## 2019-04-16 LAB — COLOGUARD: Cologuard: NEGATIVE

## 2019-04-20 ENCOUNTER — Other Ambulatory Visit: Payer: Self-pay | Admitting: Family Medicine

## 2019-04-21 LAB — COLOGUARD: COLOGUARD: NEGATIVE

## 2019-04-23 ENCOUNTER — Encounter: Payer: Self-pay | Admitting: General Practice

## 2019-04-30 ENCOUNTER — Ambulatory Visit
Admission: RE | Admit: 2019-04-30 | Discharge: 2019-04-30 | Disposition: A | Discharge status: Home / Self Care | Payer: Medicare HMO | Source: Ambulatory Visit | Attending: Family Medicine | Admitting: Family Medicine

## 2019-04-30 ENCOUNTER — Other Ambulatory Visit: Payer: Self-pay

## 2019-04-30 DIAGNOSIS — Z1231 Encounter for screening mammogram for malignant neoplasm of breast: Secondary | ICD-10-CM | POA: Diagnosis not present

## 2019-05-04 ENCOUNTER — Other Ambulatory Visit: Payer: Self-pay | Admitting: Family Medicine

## 2019-07-08 ENCOUNTER — Other Ambulatory Visit: Payer: Self-pay | Admitting: Family Medicine

## 2019-07-14 ENCOUNTER — Other Ambulatory Visit: Payer: Self-pay | Admitting: Family Medicine

## 2019-09-24 ENCOUNTER — Ambulatory Visit (INDEPENDENT_AMBULATORY_CARE_PROVIDER_SITE_OTHER): Payer: Medicare HMO | Admitting: Family Medicine

## 2019-09-24 ENCOUNTER — Encounter: Payer: Self-pay | Admitting: Family Medicine

## 2019-09-24 ENCOUNTER — Other Ambulatory Visit: Payer: Self-pay

## 2019-09-24 VITALS — BP 132/64 | HR 58 | Temp 97.9°F | Ht 64.0 in | Wt 157.0 lb

## 2019-09-24 DIAGNOSIS — Z0000 Encounter for general adult medical examination without abnormal findings: Secondary | ICD-10-CM | POA: Diagnosis not present

## 2019-09-24 DIAGNOSIS — I10 Essential (primary) hypertension: Secondary | ICD-10-CM

## 2019-09-24 LAB — CBC WITH DIFFERENTIAL/PLATELET
Basophils Absolute: 0 10*3/uL (ref 0.0–0.1)
Basophils Relative: 0.5 % (ref 0.0–3.0)
Eosinophils Absolute: 0.1 10*3/uL (ref 0.0–0.7)
Eosinophils Relative: 1.4 % (ref 0.0–5.0)
HCT: 43.4 % (ref 36.0–46.0)
Hemoglobin: 14.6 g/dL (ref 12.0–15.0)
Lymphocytes Relative: 28.9 % (ref 12.0–46.0)
Lymphs Abs: 1.8 10*3/uL (ref 0.7–4.0)
MCHC: 33.6 g/dL (ref 30.0–36.0)
MCV: 95.2 fl (ref 78.0–100.0)
Monocytes Absolute: 0.7 10*3/uL (ref 0.1–1.0)
Monocytes Relative: 11.2 % (ref 3.0–12.0)
Neutro Abs: 3.7 10*3/uL (ref 1.4–7.7)
Neutrophils Relative %: 58 % (ref 43.0–77.0)
Platelets: 217 10*3/uL (ref 150.0–400.0)
RBC: 4.56 Mil/uL (ref 3.87–5.11)
RDW: 13.8 % (ref 11.5–15.5)
WBC: 6.4 10*3/uL (ref 4.0–10.5)

## 2019-09-24 LAB — HEPATIC FUNCTION PANEL
ALT: 28 U/L (ref 0–35)
AST: 29 U/L (ref 0–37)
Albumin: 4.5 g/dL (ref 3.5–5.2)
Alkaline Phosphatase: 67 U/L (ref 39–117)
Bilirubin, Direct: 0.1 mg/dL (ref 0.0–0.3)
Total Bilirubin: 0.9 mg/dL (ref 0.2–1.2)
Total Protein: 7.3 g/dL (ref 6.0–8.3)

## 2019-09-24 LAB — BASIC METABOLIC PANEL
BUN: 13 mg/dL (ref 6–23)
CO2: 32 mEq/L (ref 19–32)
Calcium: 10.2 mg/dL (ref 8.4–10.5)
Chloride: 101 mEq/L (ref 96–112)
Creatinine, Ser: 0.8 mg/dL (ref 0.40–1.20)
GFR: 71.11 mL/min (ref >60.00)
Glucose, Bld: 98 mg/dL (ref 70–99)
Potassium: 3.6 mEq/L (ref 3.5–5.1)
Sodium: 142 mEq/L (ref 135–145)

## 2019-09-24 LAB — LIPID PANEL
Cholesterol: 151 mg/dL (ref 0–200)
HDL: 50.7 mg/dL (ref >39.00)
NonHDL: 99.91
Total CHOL/HDL Ratio: 3
Triglycerides: 265 mg/dL — ABNORMAL HIGH (ref 0.0–149.0)
VLDL: 53 mg/dL — ABNORMAL HIGH (ref 0.0–40.0)

## 2019-09-24 LAB — TSH: TSH: 3.85 u[IU]/mL (ref 0.35–4.50)

## 2019-09-24 LAB — LDL CHOLESTEROL, DIRECT: Direct LDL: 66 mg/dL

## 2019-09-24 NOTE — Progress Notes (Signed)
   Subjective:    Patient ID: Mary Cohen, female    DOB: 05/19/1950, 69 y.o.   MRN: [id number]  HPI CPE- UTD on mammo, cologuard, immunizations.  Exercising regularly.  Reviewed past medical, surgical, family and social histories.   Health Maintenance  Topic Date Due  . INFLUENZA VACCINE  09/06/2019  . MAMMOGRAM  04/29/2021  . Fecal DNA (Cologuard)  04/16/2022  . TETANUS/TDAP  02/06/2023  . DEXA SCAN  Completed  . COVID-19 Vaccine  Completed  . Hepatitis C Screening  Completed  . PNA vac Low Risk Adult  Completed    Patient Care Team    Relationship Specialty Notifications Start End  Midge Minium, MD PCP - General Family Medicine  08/26/12   Delaney Meigs, MD Consulting Physician Gastroenterology  03/01/15      Review of Systems Patient reports no vision changes, adenopathy,fever, weight change,  persistant/recurrent hoarseness , swallowing issues, chest pain, palpitations, edema, persistant/recurrent cough, hemoptysis, dyspnea (rest/exertional/paroxysmal nocturnal), gastrointestinal bleeding (melena, rectal bleeding), abdominal pain, significant heartburn, bowel changes, GU symptoms (dysuria, hematuria, incontinence), Gyn symptoms (abnormal  bleeding, pain),  syncope, focal weakness, memory loss, numbness & tingling, skin/hair/nail changes, abnormal bruising or bleeding, anxiety, or depression.   + hearing loss- pt now wearing a hearing aide  This visit occurred during the SARS-CoV-2 public health emergency.  Safety protocols were in place, including screening questions prior to the visit, additional usage of staff PPE, and extensive cleaning of exam room while observing appropriate contact time as indicated for disinfecting solutions.       Objective:   Physical Exam General Appearance:    Alert, cooperative, no distress, appears stated age  Head:    Normocephalic, without obvious abnormality, atraumatic  Eyes:    PERRL, conjunctiva/corneas clear, EOM's  intact, fundi    benign, both eyes  Ears:    Normal TM's and external ear canals, both ears  Nose:   Deferred due to COVID  Throat:   Neck:   Supple, symmetrical, trachea midline, no adenopathy;    Thyroid: no enlargement/tenderness/nodules  Back:     Symmetric, no curvature, ROM normal, no CVA tenderness  Lungs:     Clear to auscultation bilaterally, respirations unlabored  Chest Wall:    No tenderness or deformity   Heart:    Regular rate and rhythm, S1 and S2 normal, no murmur, rub   or gallop  Breast Exam:    Deferred to mammo  Abdomen:     Soft, non-tender, bowel sounds active all four quadrants,    no masses, no organomegaly  Genitalia:    Deferred  Rectal:    Extremities:   Extremities normal, atraumatic, no cyanosis or edema  Pulses:   2+ and symmetric all extremities  Skin:   Skin color, texture, turgor normal, no rashes or lesions  Lymph nodes:   Cervical, supraclavicular, and axillary nodes normal  Neurologic:   CNII-XII intact, normal strength, sensation and reflexes    throughout          Assessment & Plan:

## 2019-09-24 NOTE — Assessment & Plan Note (Signed)
Chronic problem, well controlled.  Asymptomatic.  Check labs.  No anticipated med changes.

## 2019-09-24 NOTE — Patient Instructions (Signed)
Follow up in 6 months to recheck BP and cholesterol We'll notify you of your lab results and make any changes if needed Continue to work on healthy diet and regular exercise- you can do it!! Call with any questions or concerns Stay Safe!  Stay Healthy! 

## 2019-09-24 NOTE — Assessment & Plan Note (Signed)
Pt's PE WNL.  UTD on mammo, cologuard, immunizations.  Check labs.  Anticipatory guidance provided.  

## 2019-09-29 ENCOUNTER — Other Ambulatory Visit: Payer: Self-pay | Admitting: Family Medicine

## 2019-10-04 ENCOUNTER — Other Ambulatory Visit: Payer: Self-pay | Admitting: Family Medicine

## 2019-10-05 ENCOUNTER — Telehealth: Payer: Self-pay | Admitting: Family Medicine

## 2019-10-05 NOTE — Telephone Encounter (Signed)
Medication filled to pharmacy as requested.  Electronic refill request received this morning.

## 2019-10-05 NOTE — Telephone Encounter (Signed)
Patient need her cholesterol and her Blood pressure medication called into [facility] - Next visit: 03/31/2020 - last visit:  09/24/2019.

## 2019-12-22 ENCOUNTER — Other Ambulatory Visit: Payer: Self-pay | Admitting: Family Medicine

## 2019-12-28 ENCOUNTER — Other Ambulatory Visit: Payer: Self-pay | Admitting: Family Medicine

## 2020-01-18 DIAGNOSIS — R69 Illness, unspecified: Secondary | ICD-10-CM | POA: Diagnosis not present

## 2020-03-16 ENCOUNTER — Other Ambulatory Visit: Payer: Self-pay | Admitting: Family Medicine

## 2020-03-21 ENCOUNTER — Other Ambulatory Visit: Payer: Self-pay | Admitting: Family Medicine

## 2020-03-28 ENCOUNTER — Other Ambulatory Visit: Payer: Self-pay | Admitting: Family Medicine

## 2020-03-28 ENCOUNTER — Ambulatory Visit: Payer: Medicare HMO

## 2020-03-28 DIAGNOSIS — Z1231 Encounter for screening mammogram for malignant neoplasm of breast: Secondary | ICD-10-CM

## 2020-03-31 ENCOUNTER — Ambulatory Visit (INDEPENDENT_AMBULATORY_CARE_PROVIDER_SITE_OTHER): Payer: Medicare HMO | Admitting: Family Medicine

## 2020-03-31 ENCOUNTER — Encounter: Payer: Self-pay | Admitting: Family Medicine

## 2020-03-31 ENCOUNTER — Other Ambulatory Visit: Payer: Self-pay

## 2020-03-31 ENCOUNTER — Other Ambulatory Visit: Payer: Self-pay | Admitting: Family Medicine

## 2020-03-31 VITALS — BP 116/70 | Temp 97.0°F | Resp 17 | Ht 64.0 in | Wt 159.8 lb

## 2020-03-31 DIAGNOSIS — I10 Essential (primary) hypertension: Secondary | ICD-10-CM | POA: Diagnosis not present

## 2020-03-31 DIAGNOSIS — E785 Hyperlipidemia, unspecified: Secondary | ICD-10-CM

## 2020-03-31 DIAGNOSIS — E039 Hypothyroidism, unspecified: Secondary | ICD-10-CM | POA: Diagnosis not present

## 2020-03-31 LAB — BASIC METABOLIC PANEL
BUN: 12 mg/dL (ref 6–23)
CO2: 31 mEq/L (ref 19–32)
Calcium: 10.3 mg/dL (ref 8.4–10.5)
Chloride: 99 mEq/L (ref 96–112)
Creatinine, Ser: 0.8 mg/dL (ref 0.40–1.20)
GFR: 75.1 mL/min (ref >60.00)
Glucose, Bld: 98 mg/dL (ref 70–99)
Potassium: 3.4 mEq/L — ABNORMAL LOW (ref 3.5–5.1)
Sodium: 141 mEq/L (ref 135–145)

## 2020-03-31 LAB — LIPID PANEL
Cholesterol: 159 mg/dL (ref 0–200)
HDL: 66.6 mg/dL (ref >39.00)
LDL Cholesterol: 59 mg/dL (ref 0–99)
NonHDL: 92.7
Total CHOL/HDL Ratio: 2
Triglycerides: 169 mg/dL — ABNORMAL HIGH (ref 0.0–149.0)
VLDL: 33.8 mg/dL (ref 0.0–40.0)

## 2020-03-31 LAB — CBC WITH DIFFERENTIAL/PLATELET
Basophils Absolute: 0 10*3/uL (ref 0.0–0.1)
Basophils Relative: 0.3 % (ref 0.0–3.0)
Eosinophils Absolute: 0 10*3/uL (ref 0.0–0.7)
Eosinophils Relative: 0.7 % (ref 0.0–5.0)
HCT: 43.4 % (ref 36.0–46.0)
Hemoglobin: 15 g/dL (ref 12.0–15.0)
Lymphocytes Relative: 28.6 % (ref 12.0–46.0)
Lymphs Abs: 1.8 10*3/uL (ref 0.7–4.0)
MCHC: 34.5 g/dL (ref 30.0–36.0)
MCV: 93.8 fl (ref 78.0–100.0)
Monocytes Absolute: 0.6 10*3/uL (ref 0.1–1.0)
Monocytes Relative: 10.1 % (ref 3.0–12.0)
Neutro Abs: 3.8 10*3/uL (ref 1.4–7.7)
Neutrophils Relative %: 60.3 % (ref 43.0–77.0)
Platelets: 208 10*3/uL (ref 150.0–400.0)
RBC: 4.63 Mil/uL (ref 3.87–5.11)
RDW: 14.1 % (ref 11.5–15.5)
WBC: 6.2 10*3/uL (ref 4.0–10.5)

## 2020-03-31 LAB — TSH: TSH: 2.91 u[IU]/mL (ref 0.35–4.50)

## 2020-03-31 LAB — HEPATIC FUNCTION PANEL
ALT: 32 U/L (ref 0–35)
AST: 36 U/L (ref 0–37)
Albumin: 4.4 g/dL (ref 3.5–5.2)
Alkaline Phosphatase: 63 U/L (ref 39–117)
Bilirubin, Direct: 0.2 mg/dL (ref 0.0–0.3)
Total Bilirubin: 0.9 mg/dL (ref 0.2–1.2)
Total Protein: 7.8 g/dL (ref 6.0–8.3)

## 2020-03-31 NOTE — Assessment & Plan Note (Signed)
Chronic problem.  On Crestor 20mg daily w/o difficulty.  Check labs.  Adjust meds prn  

## 2020-03-31 NOTE — Assessment & Plan Note (Signed)
Currently asymptomatic on Levothyroxine 49mcg daily.  Check labs.  Adjust meds prn

## 2020-03-31 NOTE — Progress Notes (Signed)
   Subjective:    Patient ID: Mary Cohen, female    DOB: 03-21-1950, 70 y.o.   MRN: [id number]  HPI HTN- chronic problem, on Atenolol-Chlorthalidone 50/25mg  daily w/ good control.  Denies CP, SOB, HAs, visual changes, edema.  Hyperlipidemia- chronic problem, on Crestor 20mg  daily.  Denies abd pain, N/V  Hypothyroid- chronic problem, on Levothyroxine 63mcg daily.  Denies excessive fatigue.  No changes to skin/hair/nail   Review of Systems For ROS see HPI   This visit occurred during the SARS-CoV-2 public health emergency.  Safety protocols were in place, including screening questions prior to the visit, additional usage of staff PPE, and extensive cleaning of exam room while observing appropriate contact time as indicated for disinfecting solutions.       Objective:   Physical Exam Vitals reviewed.  Constitutional:      General: She is not in acute distress.    Appearance: Normal appearance. She is well-developed and well-nourished. She is not ill-appearing.  HENT:     Head: Normocephalic and atraumatic.  Eyes:     Extraocular Movements: EOM normal.     Conjunctiva/sclera: Conjunctivae normal.     Pupils: Pupils are equal, round, and reactive to light.  Neck:     Thyroid: No thyromegaly.  Cardiovascular:     Rate and Rhythm: Normal rate and regular rhythm.     Pulses: Normal pulses and intact distal pulses.     Heart sounds: Normal heart sounds. No murmur heard.   Pulmonary:     Effort: Pulmonary effort is normal. No respiratory distress.     Breath sounds: Normal breath sounds.  Abdominal:     General: There is no distension.     Palpations: Abdomen is soft.     Tenderness: There is no abdominal tenderness.  Musculoskeletal:        General: No edema.     Cervical back: Normal range of motion and neck supple.     Right lower leg: No edema.     Left lower leg: No edema.  Lymphadenopathy:     Cervical: No cervical adenopathy.  Skin:    General: Skin is  warm and dry.  Neurological:     Mental Status: She is alert and oriented to person, place, and time.  Psychiatric:        Mood and Affect: Mood and affect normal.        Behavior: Behavior normal.           Assessment & Plan:

## 2020-03-31 NOTE — Patient Instructions (Signed)
Schedule your complete physical in 6 months We'll notify you of your lab results and make any changes if needed Continue to work on healthy diet and regular exercise- you're doing great! Call with any questions or concerns I am so sorry for your loss Elbert Ewings in there! Stay Safe!  Stay Healthy!

## 2020-03-31 NOTE — Assessment & Plan Note (Signed)
Chronic problem.  Currently well controlled on Atenolol-Chorthalidone 50/25mg .  Asymptomatic at this time.  Check labs.  No anticipated med changes.  Will follow.

## 2020-04-18 ENCOUNTER — Ambulatory Visit (INDEPENDENT_AMBULATORY_CARE_PROVIDER_SITE_OTHER): Payer: Medicare HMO

## 2020-04-18 VITALS — Ht 64.0 in | Wt 159.0 lb

## 2020-04-18 DIAGNOSIS — Z0000 Encounter for general adult medical examination without abnormal findings: Secondary | ICD-10-CM | POA: Diagnosis not present

## 2020-04-18 NOTE — Patient Instructions (Signed)
.Mary Cohen , Thank you for taking time to complete your Medicare Wellness Visit. I appreciate your ongoing commitment to your health goals. Please review the following plan we discussed and let me know if I can assist you in the future.   Screening recommendations/referrals: Colonoscopy: Completed Cologuard 04/16/2019-Due 04/16/2022 Mammogram: Scheduled for 05/16/2020 Bone Density: Due- Please call the office when you are ready to schedule. Recommended yearly ophthalmology/optometry visit for glaucoma screening and checkup Recommended yearly dental visit for hygiene and checkup  Vaccinations: Influenza vaccine: Up to date Pneumococcal vaccine: Completed vaccines Tdap vaccine: Up to date-Due-02/06/2023 Shingles vaccine: Discuss with pharmacy   Covid-19:Completed vaccines  Advanced directives: Declined information today  Conditions/risks identified: See problem list  Next appointment: Follow up in one year for your annual wellness visit    Preventive Care 1 Years and Older, Female Preventive care refers to lifestyle choices and visits with your health care provider that can promote health and wellness. What does preventive care include?  A yearly physical exam. This is also called an annual well check.  Dental exams once or twice a year.  Routine eye exams. Ask your health care provider how often you should have your eyes checked.  Personal lifestyle choices, including:  Daily care of your teeth and gums.  Regular physical activity.  Eating a healthy diet.  Avoiding tobacco and drug use.  Limiting alcohol use.  Practicing safe sex.  Taking low-dose aspirin every day.  Taking vitamin and mineral supplements as recommended by your health care provider. What happens during an annual well check? The services and screenings done by your health care provider during your annual well check will depend on your age, overall health, lifestyle risk factors, and family history of  disease. Counseling  Your health care provider may ask you questions about your:  Alcohol use.  Tobacco use.  Drug use.  Emotional well-being.  Home and relationship well-being.  Sexual activity.  Eating habits.  History of falls.  Memory and ability to understand (cognition).  Work and work Statistician.  Reproductive health. Screening  You may have the following tests or measurements:  Height, weight, and BMI.  Blood pressure.  Lipid and cholesterol levels. These may be checked every 5 years, or more frequently if you are over 76 years old.  Skin check.  Lung cancer screening. You may have this screening every year starting at age 52 if you have a 30-pack-year history of smoking and currently smoke or have quit within the past 15 years.  Fecal occult blood test (FOBT) of the stool. You may have this test every year starting at age 31.  Flexible sigmoidoscopy or colonoscopy. You may have a sigmoidoscopy every 5 years or a colonoscopy every 10 years starting at age 65.  Hepatitis C blood test.  Hepatitis B blood test.  Sexually transmitted disease (STD) testing.  Diabetes screening. This is done by checking your blood sugar (glucose) after you have not eaten for a while (fasting). You may have this done every 1-3 years.  Bone density scan. This is done to screen for osteoporosis. You may have this done starting at age 73.  Mammogram. This may be done every 1-2 years. Talk to your health care provider about how often you should have regular mammograms. Talk with your health care provider about your test results, treatment options, and if necessary, the need for more tests. Vaccines  Your health care provider may recommend certain vaccines, such as:  Influenza vaccine. This is recommended  every year.  Tetanus, diphtheria, and acellular pertussis (Tdap, Td) vaccine. You may need a Td booster every 10 years.  Zoster vaccine. You may need this after age  19.  Pneumococcal 13-valent conjugate (PCV13) vaccine. One dose is recommended after age 86.  Pneumococcal polysaccharide (PPSV23) vaccine. One dose is recommended after age 70. Talk to your health care provider about which screenings and vaccines you need and how often you need them. This information is not intended to replace advice given to you by your health care provider. Make sure you discuss any questions you have with your health care provider. Document Released: 02/18/2015 Document Revised: 10/12/2015 Document Reviewed: 11/23/2014 Elsevier Interactive Patient Education  2017 [place] Prevention in the Home Falls can cause injuries. They can happen to people of all ages. There are many things you can do to make your home safe and to help prevent falls. What can I do on the outside of my home?  Regularly fix the edges of walkways and driveways and fix any cracks.  Remove anything that might make you trip as you walk through a door, such as a raised step or threshold.  Trim any bushes or trees on the path to your home.  Use bright outdoor lighting.  Clear any walking paths of anything that might make someone trip, such as rocks or tools.  Regularly check to see if handrails are loose or broken. Make sure that both sides of any steps have handrails.  Any raised decks and porches should have guardrails on the edges.  Have any leaves, snow, or ice cleared regularly.  Use sand or salt on walking paths during winter.  Clean up any spills in your garage right away. This includes oil or grease spills. What can I do in the bathroom?  Use night lights.  Install grab bars by the toilet and in the tub and shower. Do not use towel bars as grab bars.  Use non-skid mats or decals in the tub or shower.  If you need to sit down in the shower, use a plastic, non-slip stool.  Keep the floor dry. Clean up any water that spills on the floor as soon as it happens.  Remove  soap buildup in the tub or shower regularly.  Attach bath mats securely with double-sided non-slip rug tape.  Do not have throw rugs and other things on the floor that can make you trip. What can I do in the bedroom?  Use night lights.  Make sure that you have a light by your bed that is easy to reach.  Do not use any sheets or blankets that are too big for your bed. They should not hang down onto the floor.  Have a firm chair that has side arms. You can use this for support while you get dressed.  Do not have throw rugs and other things on the floor that can make you trip. What can I do in the kitchen?  Clean up any spills right away.  Avoid walking on wet floors.  Keep items that you use a lot in easy-to-reach places.  If you need to reach something above you, use a strong step stool that has a grab bar.  Keep electrical cords out of the way.  Do not use floor polish or wax that makes floors slippery. If you must use wax, use non-skid floor wax.  Do not have throw rugs and other things on the floor that can make you trip. What  can I do with my stairs?  Do not leave any items on the stairs.  Make sure that there are handrails on both sides of the stairs and use them. Fix handrails that are broken or loose. Make sure that handrails are as long as the stairways.  Check any carpeting to make sure that it is firmly attached to the stairs. Fix any carpet that is loose or worn.  Avoid having throw rugs at the top or bottom of the stairs. If you do have throw rugs, attach them to the floor with carpet tape.  Make sure that you have a light switch at the top of the stairs and the bottom of the stairs. If you do not have them, ask someone to add them for you. What else can I do to help prevent falls?  Wear shoes that:  Do not have high heels.  Have rubber bottoms.  Are comfortable and fit you well.  Are closed at the toe. Do not wear sandals.  If you use a  stepladder:  Make sure that it is fully opened. Do not climb a closed stepladder.  Make sure that both sides of the stepladder are locked into place.  Ask someone to hold it for you, if possible.  Clearly mark and make sure that you can see:  Any grab bars or handrails.  First and last steps.  Where the edge of each step is.  Use tools that help you move around (mobility aids) if they are needed. These include:  Canes.  Walkers.  Scooters.  Crutches.  Turn on the lights when you go into a dark area. Replace any light bulbs as soon as they burn out.  Set up your furniture so you have a clear path. Avoid moving your furniture around.  If any of your floors are uneven, fix them.  If there are any pets around you, be aware of where they are.  Review your medicines with your doctor. Some medicines can make you feel dizzy. This can increase your chance of falling. Ask your doctor what other things that you can do to help prevent falls. This information is not intended to replace advice given to you by your health care provider. Make sure you discuss any questions you have with your health care provider. Document Released: 11/18/2008 Document Revised: 06/30/2015 Document Reviewed: 02/26/2014 Elsevier Interactive Patient Education  2017 [employer].

## 2020-04-18 NOTE — Progress Notes (Signed)
Subjective:   Mary Cohen is a 70 y.o. female who presents for Medicare Annual (Subsequent) preventive examination.  I connected with Mary Cohen today by telephone and verified that I am speaking with the correct person using two identifiers. Location patient: home Location provider: work Persons participating in the virtual visit: patient, Marine scientist.    I discussed the limitations, risks, security and privacy concerns of performing an evaluation and management service by telephone and the availability of in person appointments. I also discussed with the patient that there may be a patient responsible charge related to this service. The patient expressed understanding and verbally consented to this telephonic visit.    Interactive audio and video telecommunications were attempted between this provider and patient, however failed, due to patient having technical difficulties OR patient did not have access to video capability.  We continued and completed visit with audio only.  Some vital signs may be absent or patient reported.   Time Spent with patient on telephone encounter: 20 minutes   Review of Systems     Cardiac Risk Factors include: advanced age (>68men, >23 women);female gender;hypertension     Objective:    Today's Vitals   04/18/20 1456  Weight: 159 lb (72.1 kg)  Height: 5\' 4"  (1.626 m)   Body mass index is 27.29 kg/m.  Advanced Directives 04/18/2020 03/25/2019 04/18/2017 04/12/2016  Does Patient Have a Medical Advance Directive? No No No No  Would patient like information on creating a medical advance directive? No - Patient declined Yes (MAU/Ambulatory/Procedural Areas - Information given) Yes (MAU/Ambulatory/Procedural Areas - Information given) Yes (MAU/Ambulatory/Procedural Areas - Information given)    Current Medications (verified) Outpatient Encounter Medications as of 04/18/2020  Medication Sig  . atenolol-chlorthalidone (TENORETIC) 50-25 MG tablet TAKE 1  TABLET BY MOUTH EVERY DAY  . Cholecalciferol (VITAMIN D3) 10000 UNITS capsule Take 10,000 Units by mouth once a week.  . levothyroxine (SYNTHROID) 25 MCG tablet TAKE 1 TABLET BY MOUTH EVERY DAY BEFORE BREAKFAST  . Multiple Vitamin (MULTIVITAMIN) tablet Take 1 tablet by mouth daily.  . rosuvastatin (CRESTOR) 20 MG tablet TAKE 1 TABLET BY MOUTH EVERY DAY AT NIGHT  . vitamin C (ASCORBIC ACID) 500 MG tablet Take 500 mg by mouth daily.   No facility-administered encounter medications on file as of 04/18/2020.    Allergies (verified) Codeine   History: Past Medical History:  Diagnosis Date  . Fibroid 1995  . Hypertension 4/96  . Ovarian mass 04/1998   right ovarian mass  . Thyroid disease 03/1993   hypothyroidism  . Tremors of nervous system    Past Surgical History:  Procedure Laterality Date  . ABDOMINAL HYSTERECTOMY  07/1996  . BILATERAL SALPINGOOPHORECTOMY  05/1998   parasitic fibroid  . COLONOSCOPY  11/2006   negative, recheck in 10 years  . TONSILLECTOMY AND ADENOIDECTOMY  3rd grade  . TUBAL LIGATION Bilateral 1985  . VAGINAL DELIVERY  07/1996   secondary to fibroids and adenomyosis, ovaries remain   Family History  Problem Relation Age of Onset  . Hypertension Mother   . Diabetes Mother   . Hyperlipidemia Mother   . Atrial fibrillation Mother   . Hypertension Father   . Stroke Father   . Heart disease Father        A fib, CABG in past  . Thyroid disease Sister    Social History   Socioeconomic History  . Marital status: Married    Spouse name: Not on file  . Number of children: Not  on file  . Years of education: Not on file  . Highest education level: Not on file  Occupational History  . Not on file  Tobacco Use  . Smoking status: Never Smoker  . Smokeless tobacco: Never Used  Vaping Use  . Vaping Use: Never used  Substance and Sexual Activity  . Alcohol use: Yes    Alcohol/week: 3.0 standard drinks    Types: 3 Standard drinks or equivalent per week  .  Drug use: No  . Sexual activity: Yes    Birth control/protection: Surgical    Comment: hysterectomy  Other Topics Concern  . Not on file  Social History Narrative  . Not on file   Social Determinants of Health   Financial Resource Strain: Low Risk   . Difficulty of Paying Living Expenses: Not hard at all  Food Insecurity: No Food Insecurity  . Worried About Charity fundraiser in the Last Year: Never true  . Ran Out of Food in the Last Year: Never true  Transportation Needs: No Transportation Needs  . Lack of Transportation (Medical): No  . Lack of Transportation (Non-Medical): No  Physical Activity: Insufficiently Active  . Days of Exercise per Week: 2 days  . Minutes of Exercise per Session: 60 min  Stress: No Stress Concern Present  . Feeling of Stress : Not at all  Social Connections: Moderately Integrated  . Frequency of Communication with Friends and Family: More than three times a week  . Frequency of Social Gatherings with Friends and Family: More than three times a week  . Attends Religious Services: More than 4 times per year  . Active Member of Clubs or Organizations: No  . Attends Archivist Meetings: Never  . Marital Status: Married    Tobacco Counseling Counseling given: Not Answered   Clinical Intake:  Pre-visit preparation completed: Yes  Pain : No/denies pain     Nutritional Status: BMI 25 -29 Overweight Nutritional Risks: None Diabetes: No  How often do you need to have someone help you when you read instructions, pamphlets, or other written materials from your doctor or pharmacy?: 1 - Never  Diabetic?No  Interpreter Needed?: No  Information entered by :: Caroleen Hamman LPN   Activities of Daily Living In your present state of health, do you have any difficulty performing the following activities: 04/18/2020 03/31/2020  Hearing? N N  Comment - -  Vision? N N  Difficulty concentrating or making decisions? N N  Walking or  climbing stairs? N N  Dressing or bathing? N N  Doing errands, shopping? N N  Preparing Food and eating ? N -  Using the Toilet? N -  In the past six months, have you accidently leaked urine? N -  Do you have problems with loss of bowel control? N -  Managing your Medications? N -  Managing your Finances? N -  Housekeeping or managing your Housekeeping? N -  Some recent data might be hidden    Patient Care Team: Midge Minium, MD as PCP - General (Family Medicine) Delaney Meigs, MD as Consulting Physician (Gastroenterology)  Indicate any recent Medical Services you may have received from other than [facility] providers in the past year (date may be approximate).     Assessment:   This is a routine wellness examination for [place].  Hearing/Vision screen  Hearing Screening   125Hz  250Hz  500Hz  1000Hz  2000Hz  3000Hz  4000Hz  6000Hz  8000Hz   Right ear:  Left ear:           Comments: No issues  Vision Screening Comments: Reading glasses Last eye exam-several years ago  Dietary issues and exercise activities discussed: Current Exercise Habits: Home exercise routine, Type of exercise: treadmill (swimming), Time (Minutes): 60, Frequency (Times/Week): 2, Weekly Exercise (Minutes/Week): 120, Intensity: Mild, Exercise limited by: None identified  Goals    . Patient Stated     Increase activity, drink more water & eat healthier    . Weight (lb) < 140 lb (63.5 kg)     Lose weight by increasing activity.       Depression Screen PHQ 2/9 Scores 04/18/2020 03/31/2020 09/24/2019 03/25/2019 09/19/2018 04/29/2018 04/18/2017  PHQ - 2 Score 1 0 0 0 0 0 0  PHQ- 9 Score - 0 0 - 0 0 -  Exception Documentation - - - - - - -    Fall Risk Fall Risk  04/18/2020 03/31/2020 09/24/2019 03/25/2019 09/19/2018  Falls in the past year? 0 0 0 0 0  Number falls in past yr: 0 0 0 0 0  Injury with Fall? 0 0 0 0 0  Risk for fall due to : - No Fall Risks - - -  Follow up Falls prevention discussed - - Falls  evaluation completed;Education provided;Falls prevention discussed -    FALL RISK PREVENTION PERTAINING TO THE HOME:  Any stairs in or around the home? Yes  If so, are there any without handrails? No  Home free of loose throw rugs in walkways, pet beds, electrical cords, etc? Yes  Adequate lighting in your home to reduce risk of falls? Yes   ASSISTIVE DEVICES UTILIZED TO PREVENT FALLS:  Life alert? No  Use of a cane, walker or w/c? No  Grab bars in the bathroom? Yes  Shower chair or bench in shower? No  Elevated toilet seat or a handicapped toilet? No   TIMED UP AND GO:  Was the test performed? No .phone visit    Cognitive Function:Normal cognitive status assessed by  this Nurse Health Advisor. No abnormalities found.          Immunizations Immunization History  Administered Date(s) Administered  . DTaP 08/13/2011  . Fluad Quad(high Dose 65+) 01/18/2020  . Influenza, High Dose Seasonal PF 11/01/2017, 12/09/2018  . Influenza,inj,Quad PF,6+ Mos 10/18/2015, 10/15/2016  . Influenza-Unspecified 11/06/2014  . Moderna Sars-Covid-2 Vaccination 03/09/2019, 04/07/2019, 12/08/2019  . Pneumococcal Conjugate-13 04/12/2016  . Pneumococcal Polysaccharide-23 04/18/2017  . Td 02/05/2013    TDAP status: Up to date  Flu Vaccine status: Up to date  Pneumococcal vaccine status: Up to date  Covid-19 vaccine status: Completed vaccines  Qualifies for Shingles Vaccine? Yes   Zostavax completed No   Shingrix Completed?: No.    Education has been provided regarding the importance of this vaccine. Patient has been advised to call insurance company to determine out of pocket expense if they have not yet received this vaccine. Advised may also receive vaccine at local pharmacy or Health Dept. Verbalized acceptance and understanding.  Screening Tests Health Maintenance  Topic Date Due  . MAMMOGRAM  04/29/2021  . Fecal DNA (Cologuard)  04/16/2022  . TETANUS/TDAP  02/06/2023  . INFLUENZA  VACCINE  Completed  . DEXA SCAN  Completed  . COVID-19 Vaccine  Completed  . Hepatitis C Screening  Completed  . PNA vac Low Risk Adult  Completed  . HPV VACCINES  Aged Out    Health Maintenance  There are no preventive care reminders  to display for this patient.  Colorectal cancer screening: Type of screening: Cologuard. Completed 04/16/2019. Repeat every 3 years  Mammogram status: Scheduled for 05/16/2020  Bone Density status: Due-Declined today.  Lung Cancer Screening: (Low Dose CT Chest recommended if Age 4-80 years, 30 pack-year currently smoking OR have quit w/in 15years.) does not qualify.    Additional Screening:  Hepatitis C Screening:  Completed 04/12/2016  Vision Screening: Recommended annual ophthalmology exams for early detection of glaucoma and other disorders of the eye. Is the patient up to date with their annual eye exam?  No  Who is the provider or what is the name of the office in which the patient attends annual eye exams? unsure Patient advised to make an appt  Dental Screening: Recommended annual dental exams for proper oral hygiene  Community Resource Referral / Chronic Care Management: CRR required this visit?  No   CCM required this visit?  No      Plan:     I have personally reviewed and noted the following in the patient's chart:   . Medical and social history . Use of alcohol, tobacco or illicit drugs  . Current medications and supplements . Functional ability and status . Nutritional status . Physical activity . Advanced directives . List of other physicians . Hospitalizations, surgeries, and ER visits in previous 12 months . Vitals . Screenings to include cognitive, depression, and falls . Referrals and appointments  In addition, I have reviewed and discussed with patient certain preventive protocols, quality metrics, and best practice recommendations. A written personalized care plan for preventive services as well as general  preventive health recommendations were provided to patient.   Due to this being a telephonic visit, the after visit summary with patients personalized plan was offered to patient via mail or my-chart. Patient would like to access on my-chart.    Marta Antu, LPN   [id number]  Nurse Health Advisor  Nurse Notes: None

## 2020-05-16 ENCOUNTER — Other Ambulatory Visit: Payer: Self-pay

## 2020-05-16 ENCOUNTER — Ambulatory Visit
Admission: RE | Admit: 2020-05-16 | Discharge: 2020-05-16 | Disposition: A | Discharge status: Home / Self Care | Payer: Medicare HMO | Source: Ambulatory Visit | Attending: Family Medicine | Admitting: Family Medicine

## 2020-05-16 DIAGNOSIS — Z1231 Encounter for screening mammogram for malignant neoplasm of breast: Secondary | ICD-10-CM | POA: Diagnosis not present

## 2020-06-02 IMAGING — MG DIGITAL SCREENING BILATERAL MAMMOGRAM WITH TOMO AND CAD
8 series · 8 of 24 positions shown · non-contrast
Comparison: Previous exam(s).

CLINICAL DATA: Screening.

EXAM:
DIGITAL SCREENING BILATERAL MAMMOGRAM WITH TOMO AND CAD

[R CC synth-2D]
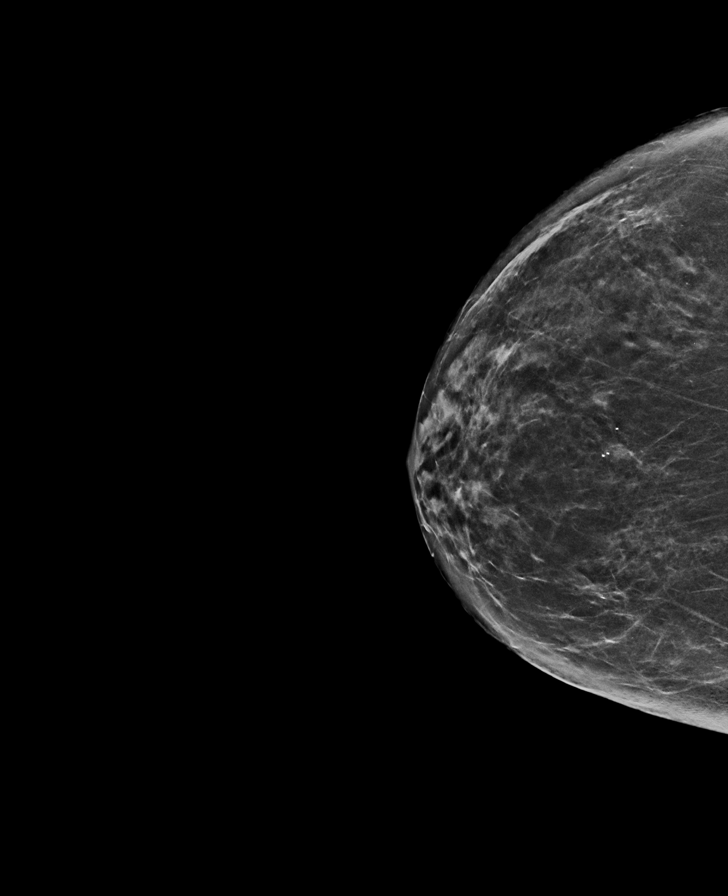

[L MLO synth-2D]
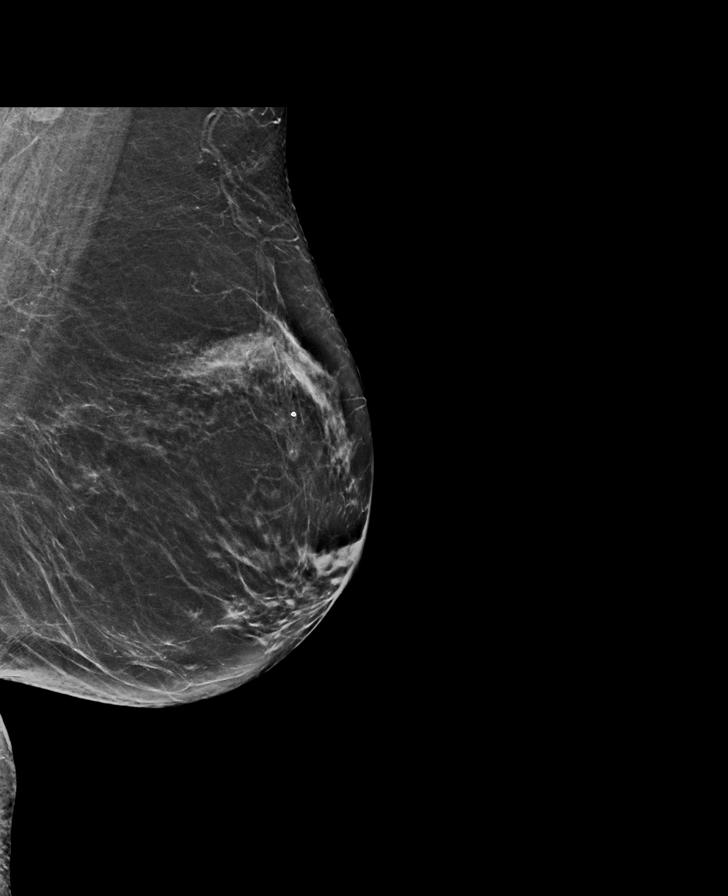

[L CC synth-2D]
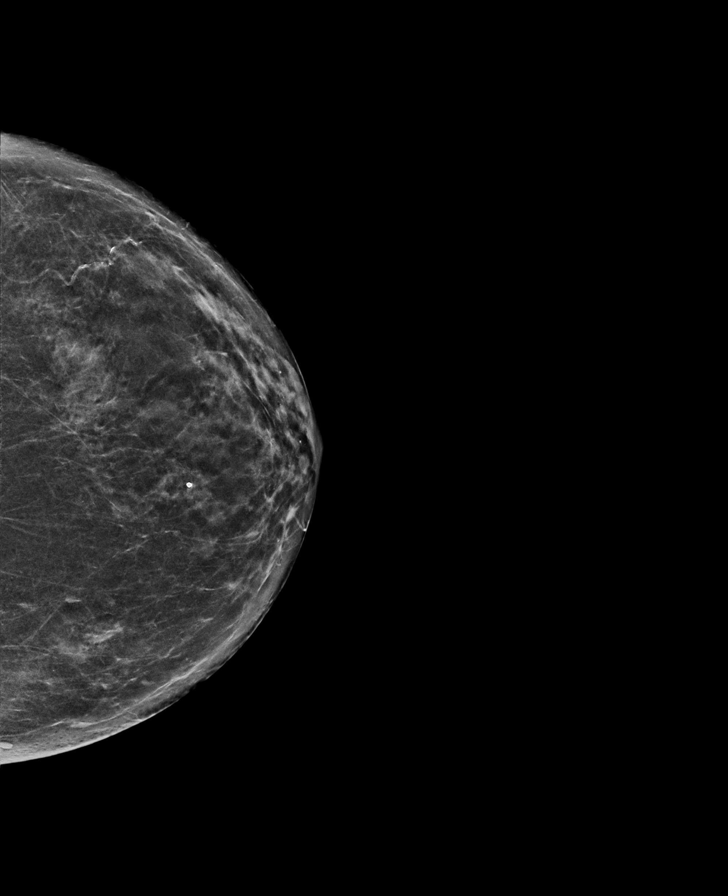

[R MLO synth-2D]
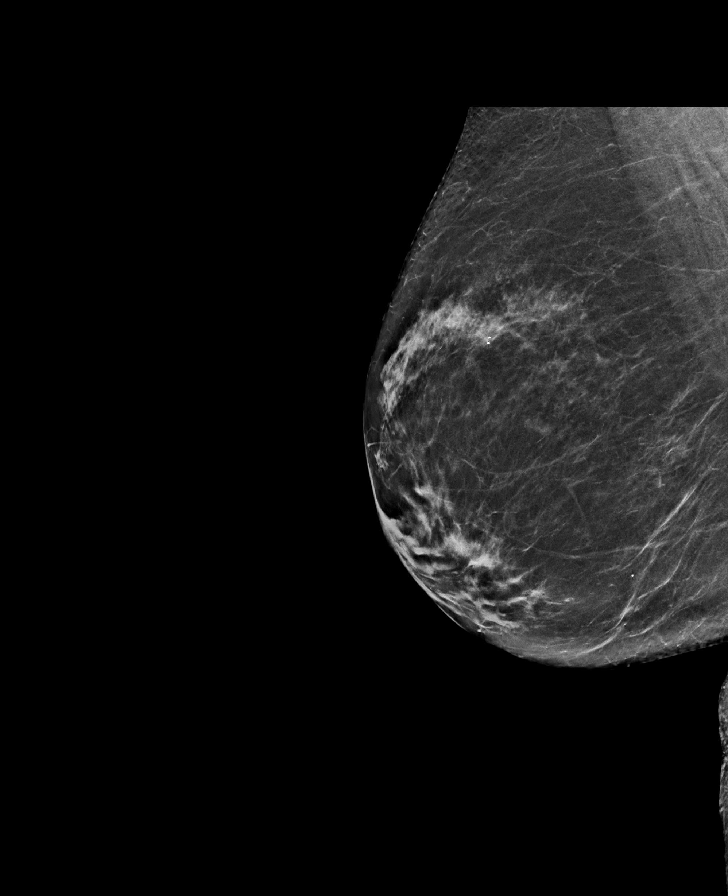

[L CC tomo · tomo slice 30/59.0]
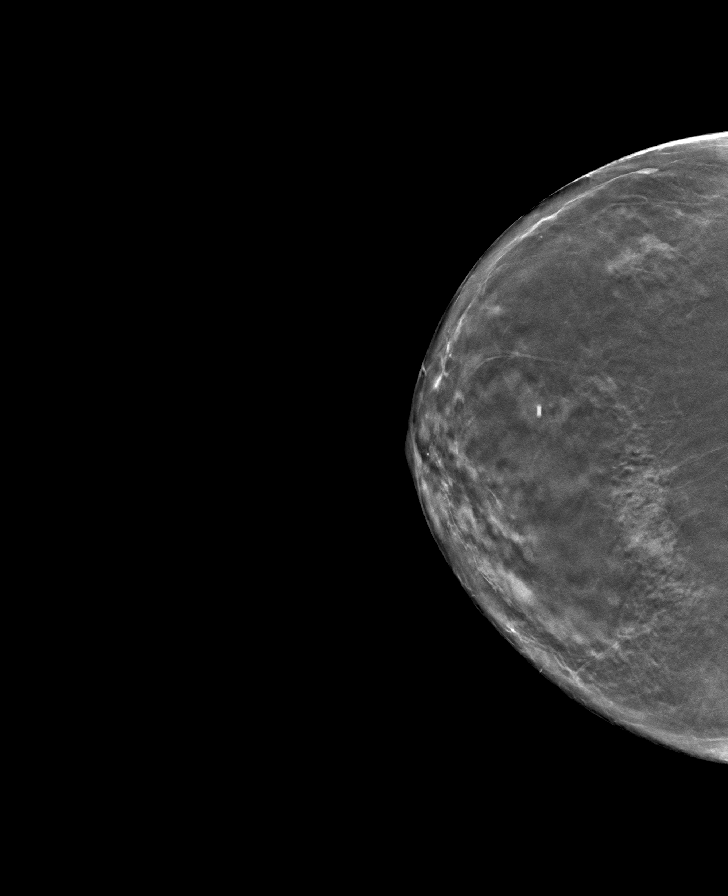

[R CC tomo · tomo slice 31/62.0]
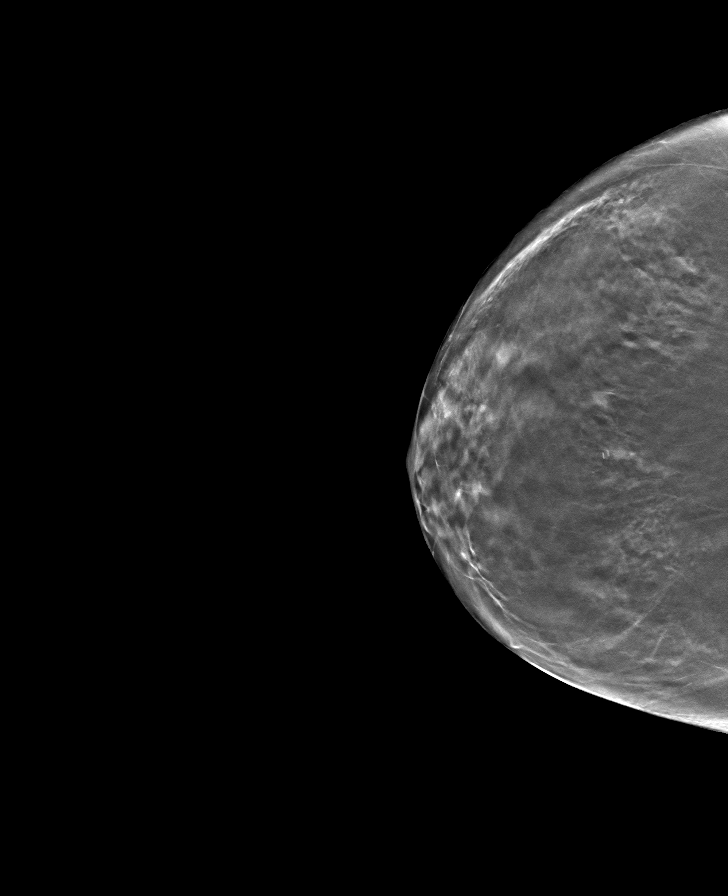

[R MLO tomo · tomo slice 33/66.0]
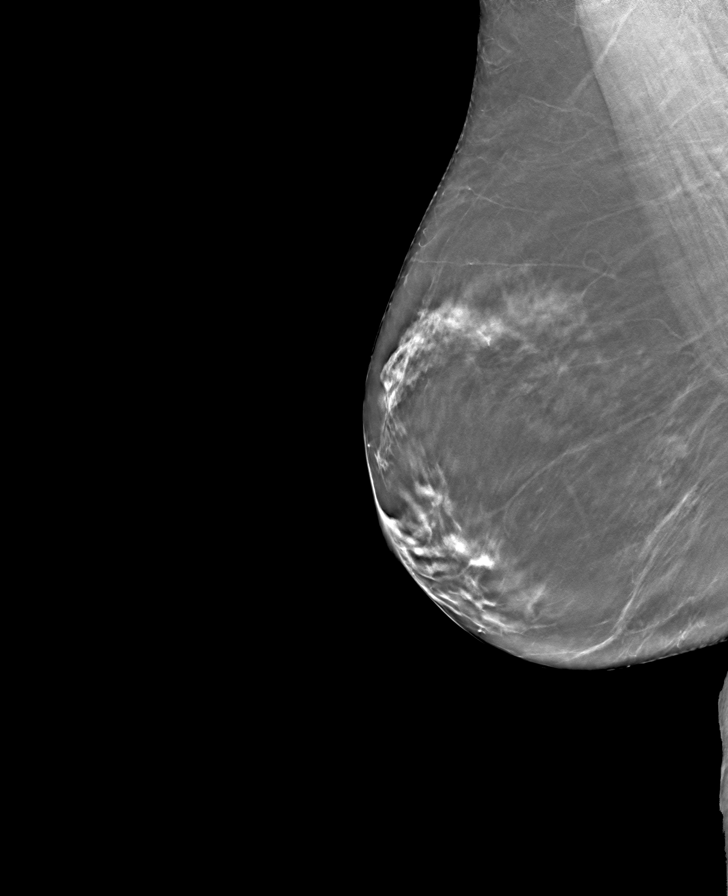

[L MLO tomo · tomo slice 34/67.0]
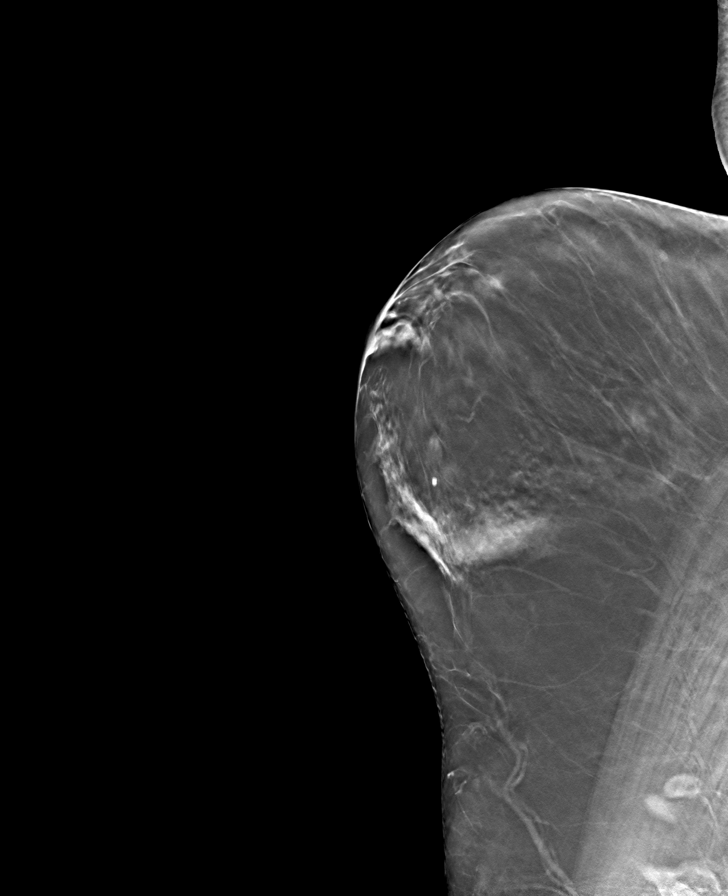

[8 of 24 positions shown; findings below may reference images not displayed]

ACR Breast Density Category b: There are scattered areas of
fibroglandular density.
FINDINGS: In the right breast, a possible mass warrants further evaluation. In
the left breast, no findings suspicious for malignancy. Images were
processed with CAD.
IMPRESSION: Further evaluation is suggested for possible mass in the right
breast.

RECOMMENDATION:
Diagnostic mammogram and possibly ultrasound of the right breast.
(Code:T1-A-550)

The patient will be contacted regarding the findings, and additional
imaging will be scheduled.

BI-RADS CATEGORY  0: Incomplete. Need additional imaging evaluation
and/or prior mammograms for comparison.

## 2020-06-12 ENCOUNTER — Other Ambulatory Visit: Payer: Self-pay | Admitting: Family Medicine

## 2020-06-20 ENCOUNTER — Other Ambulatory Visit: Payer: Self-pay | Admitting: Family Medicine

## 2020-07-11 ENCOUNTER — Other Ambulatory Visit (HOSPITAL_BASED_OUTPATIENT_CLINIC_OR_DEPARTMENT_OTHER): Payer: Self-pay | Admitting: Family Medicine

## 2020-07-11 ENCOUNTER — Telehealth: Payer: Self-pay | Admitting: Family Medicine

## 2020-07-11 NOTE — Telephone Encounter (Signed)
Called patient to inform. Patient voiced understanding. 

## 2020-07-11 NOTE — Telephone Encounter (Signed)
If ok w/ her, I will order at her upcoming appt in August

## 2020-07-11 NOTE — Telephone Encounter (Signed)
Patient would like for you to order her a bone density scan

## 2020-08-03 ENCOUNTER — Encounter: Payer: Self-pay | Admitting: *Deleted

## 2020-08-16 ENCOUNTER — Telehealth: Payer: Self-pay | Admitting: *Deleted

## 2020-08-16 NOTE — Chronic Care Management (AMB) (Signed)
  Chronic Care Management   Note  08/16/2020 Name: Mary Cohen MRN: [id number] DOB: [birth year]  Mary Cohen is a 70 y.o. year old female who is a primary care patient of Birdie Riddle, Aundra Millet, MD. I reached out to Victorino December by phone today in response to a referral sent by Ms. Mary Colonel Vanvranken's PCP Midge Minium, MD     [name] was given information about Chronic Care Management services today including:  CCM service includes personalized support from designated clinical staff supervised by her physician, including individualized plan of care and coordination with other care providers 24/7 contact phone numbers for assistance for urgent and routine care needs. Service will only be billed when office clinical staff spend 20 minutes or more in a month to coordinate care. Only one practitioner may furnish and bill the service in a calendar month. The patient may stop CCM services at any time (effective at the end of the month) by phone call to the office staff. The patient will be responsible for cost sharing (co-pay) of up to 20% of the service fee (after annual deductible is met).  Patient did not agree to enrollment in care management services and does not wish to consider at this time.  Follow up plan: Patient declines further follow up and engagement by the care management team. Appropriate care team members and provider have been notified via electronic communication.    Julian Hy, [place] [facility]  Direct Dial: [phone]

## 2020-09-10 ENCOUNTER — Other Ambulatory Visit: Payer: Self-pay | Admitting: Family Medicine

## 2020-09-17 ENCOUNTER — Other Ambulatory Visit: Payer: Self-pay | Admitting: Family Medicine

## 2020-09-28 ENCOUNTER — Encounter: Payer: Medicare HMO | Admitting: Family Medicine

## 2020-09-29 ENCOUNTER — Other Ambulatory Visit: Payer: Self-pay | Admitting: Family Medicine

## 2020-10-01 DIAGNOSIS — Z20822 Contact with and (suspected) exposure to covid-19: Secondary | ICD-10-CM | POA: Diagnosis not present

## 2020-10-07 DIAGNOSIS — Z20822 Contact with and (suspected) exposure to covid-19: Secondary | ICD-10-CM | POA: Diagnosis not present

## 2020-11-03 ENCOUNTER — Encounter: Payer: Medicare HMO | Admitting: Family Medicine

## 2020-11-05 DIAGNOSIS — Z20822 Contact with and (suspected) exposure to covid-19: Secondary | ICD-10-CM | POA: Diagnosis not present

## 2020-11-23 ENCOUNTER — Ambulatory Visit (INDEPENDENT_AMBULATORY_CARE_PROVIDER_SITE_OTHER): Payer: Medicare HMO | Admitting: Family Medicine

## 2020-11-23 ENCOUNTER — Other Ambulatory Visit: Payer: Self-pay

## 2020-11-23 ENCOUNTER — Encounter: Payer: Self-pay | Admitting: Family Medicine

## 2020-11-23 VITALS — BP 120/80 | HR 68 | Temp 97.4°F | Resp 16 | Ht 64.0 in | Wt 158.8 lb

## 2020-11-23 DIAGNOSIS — Z0000 Encounter for general adult medical examination without abnormal findings: Secondary | ICD-10-CM

## 2020-11-23 DIAGNOSIS — I10 Essential (primary) hypertension: Secondary | ICD-10-CM | POA: Diagnosis not present

## 2020-11-23 DIAGNOSIS — M8580 Other specified disorders of bone density and structure, unspecified site: Secondary | ICD-10-CM | POA: Diagnosis not present

## 2020-11-23 DIAGNOSIS — Z23 Encounter for immunization: Secondary | ICD-10-CM | POA: Diagnosis not present

## 2020-11-23 LAB — HEPATIC FUNCTION PANEL
ALT: 20 U/L (ref 0–35)
AST: 27 U/L (ref 0–37)
Albumin: 4.3 g/dL (ref 3.5–5.2)
Alkaline Phosphatase: 72 U/L (ref 39–117)
Bilirubin, Direct: 0.2 mg/dL (ref 0.0–0.3)
Total Bilirubin: 0.9 mg/dL (ref 0.2–1.2)
Total Protein: 7.3 g/dL (ref 6.0–8.3)

## 2020-11-23 LAB — CBC WITH DIFFERENTIAL/PLATELET
Basophils Absolute: 0 10*3/uL (ref 0.0–0.1)
Basophils Relative: 0.2 % (ref 0.0–3.0)
Eosinophils Absolute: 0.1 10*3/uL (ref 0.0–0.7)
Eosinophils Relative: 0.7 % (ref 0.0–5.0)
HCT: 41.8 % (ref 36.0–46.0)
Hemoglobin: 14.2 g/dL (ref 12.0–15.0)
Lymphocytes Relative: 25.2 % (ref 12.0–46.0)
Lymphs Abs: 1.8 10*3/uL (ref 0.7–4.0)
MCHC: 33.9 g/dL (ref 30.0–36.0)
MCV: 92.8 fl (ref 78.0–100.0)
Monocytes Absolute: 0.8 10*3/uL (ref 0.1–1.0)
Monocytes Relative: 11.6 % (ref 3.0–12.0)
Neutro Abs: 4.5 10*3/uL (ref 1.4–7.7)
Neutrophils Relative %: 62.3 % (ref 43.0–77.0)
Platelets: 278 10*3/uL (ref 150.0–400.0)
RBC: 4.51 Mil/uL (ref 3.87–5.11)
RDW: 14.5 % (ref 11.5–15.5)
WBC: 7.2 10*3/uL (ref 4.0–10.5)

## 2020-11-23 LAB — LIPID PANEL
Cholesterol: 143 mg/dL (ref 0–200)
HDL: 52 mg/dL (ref >39.00)
LDL Cholesterol: 56 mg/dL (ref 0–99)
NonHDL: 91.19
Total CHOL/HDL Ratio: 3
Triglycerides: 174 mg/dL — ABNORMAL HIGH (ref 0.0–149.0)
VLDL: 34.8 mg/dL (ref 0.0–40.0)

## 2020-11-23 LAB — BASIC METABOLIC PANEL
BUN: 10 mg/dL (ref 6–23)
CO2: 32 mEq/L (ref 19–32)
Calcium: 9.6 mg/dL (ref 8.4–10.5)
Chloride: 97 mEq/L (ref 96–112)
Creatinine, Ser: 0.82 mg/dL (ref 0.40–1.20)
GFR: 72.57 mL/min (ref >60.00)
Glucose, Bld: 109 mg/dL — ABNORMAL HIGH (ref 70–99)
Potassium: 3 mEq/L — ABNORMAL LOW (ref 3.5–5.1)
Sodium: 138 mEq/L (ref 135–145)

## 2020-11-23 LAB — TSH: TSH: 4.3 u[IU]/mL (ref 0.35–5.50)

## 2020-11-23 LAB — VITAMIN D 25 HYDROXY (VIT D DEFICIENCY, FRACTURES): VITD: 33.72 ng/mL (ref 30.00–100.00)

## 2020-11-23 NOTE — Patient Instructions (Addendum)
Follow up in 6 months to recheck BP and cholesterol We'll notify you of your lab results and make any changes if needed Continue to work on healthy diet and regular exercise- you can do it!! Call with any questions or concerns Stay Safe!  Stay Healthy! 

## 2020-11-23 NOTE — Assessment & Plan Note (Signed)
Pt's PE WNL and unchanged from previous.  UTD on mammo, cologuard.  DEXA ordered.  UTD on PNA vaccines.  Will get flu today.  Check labs.  Anticipatory guidance provided.

## 2020-11-23 NOTE — Assessment & Plan Note (Signed)
Check Vit D.  Replete prn.  Due for repeat DEXA- ordered

## 2020-11-23 NOTE — Progress Notes (Signed)
   Subjective:    Patient ID: Mary Cohen, female    DOB: 07/25/1950, 70 y.o.   MRN: [id number]  HPI CPE- UTD on mammo, cologuard.  UTD on PNA vaccines, Tdap.  Will get flu today.  Patient Care Team    Relationship Specialty Notifications Start End  Midge Minium, MD PCP - General Family Medicine  08/26/12   Delaney Meigs, MD Consulting Physician Gastroenterology  03/01/15     Health Maintenance  Topic Date Due   Zoster Vaccines- Shingrix (1 of 2) Never done   COVID-19 Vaccine (4 - Booster for Moderna series) 04/06/2020   INFLUENZA VACCINE  09/05/2020   Fecal DNA (Cologuard)  04/16/2022   MAMMOGRAM  05/17/2022   TETANUS/TDAP  02/06/2023   DEXA SCAN  Completed   Hepatitis C Screening  Completed   HPV VACCINES  Aged Out      Review of Systems Patient reports no vision changes, adenopathy,fever, weight change,  persistant/recurrent hoarseness , swallowing issues, chest pain, palpitations, edema, persistant/recurrent cough, hemoptysis, dyspnea (rest/exertional/paroxysmal nocturnal), gastrointestinal bleeding (melena, rectal bleeding), abdominal pain, significant heartburn, bowel changes, GU symptoms (dysuria, hematuria, incontinence), Gyn symptoms (abnormal  bleeding, pain),  syncope, focal weakness, memory loss, numbness & tingling, skin/hair/nail changes, abnormal bruising or bleeding, anxiety, or depression.   + hearing loss  This visit occurred during the SARS-CoV-2 public health emergency.  Safety protocols were in place, including screening questions prior to the visit, additional usage of staff PPE, and extensive cleaning of exam room while observing appropriate contact time as indicated for disinfecting solutions.      Objective:   Physical Exam General Appearance:    Alert, cooperative, no distress, appears stated age  Head:    Normocephalic, without obvious abnormality, atraumatic  Eyes:    PERRL, conjunctiva/corneas clear, EOM's intact, fundi    benign,  both eyes  Ears:    Normal TM's and external ear canals, both ears  Nose:   Deferred due to COVID  Throat:   Neck:   Supple, symmetrical, trachea midline, no adenopathy;    Thyroid: no enlargement/tenderness/nodules  Back:     Symmetric, no curvature, ROM normal, no CVA tenderness  Lungs:     Clear to auscultation bilaterally, respirations unlabored  Chest Wall:    No tenderness or deformity   Heart:    Regular rate and rhythm, S1 and S2 normal, no murmur, rub   or gallop  Breast Exam:    Deferred to mammo  Abdomen:     Soft, non-tender, bowel sounds active all four quadrants,    no masses, no organomegaly  Genitalia:    Deferred to GYN  Rectal:    Extremities:   Extremities normal, atraumatic, no cyanosis or edema  Pulses:   2+ and symmetric all extremities  Skin:   Skin color, texture, turgor normal, no rashes or lesions  Lymph nodes:   Cervical, supraclavicular, and axillary nodes normal  Neurologic:   CNII-XII intact, normal strength, sensation and reflexes    throughout          Assessment & Plan:

## 2020-11-23 NOTE — Assessment & Plan Note (Signed)
Chronic problem.  Well controlled.  Currently asymptomatic.  Check labs due to chlorthalidone.  No anticipated med changes.  Will follow.

## 2020-11-24 ENCOUNTER — Other Ambulatory Visit: Payer: Self-pay

## 2020-11-24 DIAGNOSIS — E876 Hypokalemia: Secondary | ICD-10-CM

## 2020-11-24 MED ORDER — POTASSIUM CHLORIDE CRYS ER 20 MEQ PO TBCR: 20.0000 meq | EXTENDED_RELEASE_TABLET | Freq: Every day | ORAL | 3 refills | Status: DC

## 2020-12-02 ENCOUNTER — Other Ambulatory Visit (INDEPENDENT_AMBULATORY_CARE_PROVIDER_SITE_OTHER): Payer: Medicare HMO

## 2020-12-02 ENCOUNTER — Other Ambulatory Visit: Payer: Self-pay

## 2020-12-02 DIAGNOSIS — E876 Hypokalemia: Secondary | ICD-10-CM | POA: Diagnosis not present

## 2020-12-02 LAB — BASIC METABOLIC PANEL
BUN: 17 mg/dL (ref 6–23)
CO2: 31 mEq/L (ref 19–32)
Calcium: 9.8 mg/dL (ref 8.4–10.5)
Chloride: 102 mEq/L (ref 96–112)
Creatinine, Ser: 1.03 mg/dL (ref 0.40–1.20)
GFR: 55.19 mL/min — ABNORMAL LOW (ref >60.00)
Glucose, Bld: 109 mg/dL — ABNORMAL HIGH (ref 70–99)
Potassium: 4.1 mEq/L (ref 3.5–5.1)
Sodium: 141 mEq/L (ref 135–145)

## 2020-12-22 ENCOUNTER — Other Ambulatory Visit: Payer: Self-pay

## 2020-12-22 ENCOUNTER — Ambulatory Visit (HOSPITAL_BASED_OUTPATIENT_CLINIC_OR_DEPARTMENT_OTHER)
Admission: RE | Admit: 2020-12-22 | Discharge: 2020-12-22 | Disposition: A | Discharge status: Home / Self Care | Payer: Medicare HMO | Source: Ambulatory Visit | Attending: Family Medicine | Admitting: Family Medicine

## 2020-12-22 ENCOUNTER — Other Ambulatory Visit (HOSPITAL_BASED_OUTPATIENT_CLINIC_OR_DEPARTMENT_OTHER): Payer: Self-pay | Admitting: Family Medicine

## 2020-12-22 DIAGNOSIS — Z780 Asymptomatic menopausal state: Secondary | ICD-10-CM | POA: Diagnosis not present

## 2020-12-22 DIAGNOSIS — E039 Hypothyroidism, unspecified: Secondary | ICD-10-CM | POA: Diagnosis not present

## 2020-12-22 DIAGNOSIS — M8580 Other specified disorders of bone density and structure, unspecified site: Secondary | ICD-10-CM

## 2020-12-22 DIAGNOSIS — Z13820 Encounter for screening for osteoporosis: Secondary | ICD-10-CM | POA: Diagnosis not present

## 2020-12-22 DIAGNOSIS — M85851 Other specified disorders of bone density and structure, right thigh: Secondary | ICD-10-CM | POA: Diagnosis not present

## 2021-01-13 ENCOUNTER — Other Ambulatory Visit: Payer: Self-pay | Admitting: Family Medicine

## 2021-01-23 ENCOUNTER — Other Ambulatory Visit: Payer: Self-pay | Admitting: Family Medicine

## 2021-02-13 ENCOUNTER — Other Ambulatory Visit: Payer: Self-pay | Admitting: Family Medicine

## 2021-02-27 DIAGNOSIS — H903 Sensorineural hearing loss, bilateral: Secondary | ICD-10-CM | POA: Insufficient documentation

## 2021-05-10 ENCOUNTER — Other Ambulatory Visit: Payer: Self-pay | Admitting: Family Medicine

## 2021-05-11 ENCOUNTER — Other Ambulatory Visit: Payer: Self-pay | Admitting: Family Medicine

## 2021-05-24 ENCOUNTER — Ambulatory Visit (INDEPENDENT_AMBULATORY_CARE_PROVIDER_SITE_OTHER): Payer: Medicare HMO | Admitting: Family Medicine

## 2021-05-24 ENCOUNTER — Encounter: Payer: Self-pay | Admitting: Family Medicine

## 2021-05-24 ENCOUNTER — Ambulatory Visit: Payer: Medicare HMO | Admitting: Family Medicine

## 2021-05-24 ENCOUNTER — Other Ambulatory Visit: Payer: Self-pay

## 2021-05-24 VITALS — BP 137/59 | HR 57 | Temp 98.0°F | Resp 17 | Ht 64.0 in | Wt 163.8 lb

## 2021-05-24 DIAGNOSIS — Z1231 Encounter for screening mammogram for malignant neoplasm of breast: Secondary | ICD-10-CM | POA: Diagnosis not present

## 2021-05-24 DIAGNOSIS — E039 Hypothyroidism, unspecified: Secondary | ICD-10-CM

## 2021-05-24 DIAGNOSIS — I10 Essential (primary) hypertension: Secondary | ICD-10-CM | POA: Diagnosis not present

## 2021-05-24 DIAGNOSIS — E785 Hyperlipidemia, unspecified: Secondary | ICD-10-CM | POA: Diagnosis not present

## 2021-05-24 LAB — CBC WITH DIFFERENTIAL/PLATELET
Basophils Absolute: 0 10*3/uL (ref 0.0–0.1)
Basophils Relative: 0.3 % (ref 0.0–3.0)
Eosinophils Absolute: 0.1 10*3/uL (ref 0.0–0.7)
Eosinophils Relative: 0.9 % (ref 0.0–5.0)
HCT: 44.2 % (ref 36.0–46.0)
Hemoglobin: 14.8 g/dL (ref 12.0–15.0)
Lymphocytes Relative: 27.3 % (ref 12.0–46.0)
Lymphs Abs: 2 10*3/uL (ref 0.7–4.0)
MCHC: 33.5 g/dL (ref 30.0–36.0)
MCV: 94.9 fl (ref 78.0–100.0)
Monocytes Absolute: 0.7 10*3/uL (ref 0.1–1.0)
Monocytes Relative: 9.6 % (ref 3.0–12.0)
Neutro Abs: 4.4 10*3/uL (ref 1.4–7.7)
Neutrophils Relative %: 61.9 % (ref 43.0–77.0)
Platelets: 218 10*3/uL (ref 150.0–400.0)
RBC: 4.66 Mil/uL (ref 3.87–5.11)
RDW: 14.1 % (ref 11.5–15.5)
WBC: 7.2 10*3/uL (ref 4.0–10.5)

## 2021-05-24 LAB — BASIC METABOLIC PANEL
BUN: 14 mg/dL (ref 6–23)
CO2: 32 mEq/L (ref 19–32)
Calcium: 10 mg/dL (ref 8.4–10.5)
Chloride: 101 mEq/L (ref 96–112)
Creatinine, Ser: 0.83 mg/dL (ref 0.40–1.20)
GFR: 71.27 mL/min (ref >60.00)
Glucose, Bld: 98 mg/dL (ref 70–99)
Potassium: 4.2 mEq/L (ref 3.5–5.1)
Sodium: 143 mEq/L (ref 135–145)

## 2021-05-24 LAB — HEPATIC FUNCTION PANEL
ALT: 29 U/L (ref 0–35)
AST: 32 U/L (ref 0–37)
Albumin: 4.5 g/dL (ref 3.5–5.2)
Alkaline Phosphatase: 63 U/L (ref 39–117)
Bilirubin, Direct: 0.1 mg/dL (ref 0.0–0.3)
Total Bilirubin: 0.8 mg/dL (ref 0.2–1.2)
Total Protein: 7.3 g/dL (ref 6.0–8.3)

## 2021-05-24 LAB — LIPID PANEL
Cholesterol: 160 mg/dL (ref 0–200)
HDL: 57.4 mg/dL (ref >39.00)
LDL Cholesterol: 67 mg/dL (ref 0–99)
NonHDL: 102.49
Total CHOL/HDL Ratio: 3
Triglycerides: 178 mg/dL — ABNORMAL HIGH (ref 0.0–149.0)
VLDL: 35.6 mg/dL (ref 0.0–40.0)

## 2021-05-24 LAB — TSH: TSH: 3.02 u[IU]/mL (ref 0.35–5.50)

## 2021-05-24 NOTE — Assessment & Plan Note (Signed)
Chronic problem.  Currently asymptomatic on Levothyroxine 40mg daily.  Check labs.  Adjust meds prn  ?

## 2021-05-24 NOTE — Assessment & Plan Note (Signed)
Chronic problem.  Tolerating Crestor '20mg'$  nightly w/o difficulty.  Check labs.  Adjust meds prn  ?

## 2021-05-24 NOTE — Patient Instructions (Addendum)
Schedule your complete physical in 6 months We'll notify you of your lab results and make any changes if needed Continue to work on healthy diet and regular exercise- you can do it! Call with any questions or concerns Stay Safe!  Stay Healthy! 

## 2021-05-24 NOTE — Assessment & Plan Note (Signed)
Chronic problem.  Well controlled on Atenolol Chlorthalidone 50/'25mg'$  daily.  Currently asymptomatic.  Check labs due to diuretic use but no anticipated med changes. ?

## 2021-05-24 NOTE — Progress Notes (Signed)
? ?  Subjective:  ? ? Patient ID: Mary Cohen, female    DOB: December 21, 1950, 71 y.o.   MRN: [id number] ? ?HPI ?HTN- chronic problem, on Atenolol Chlorthalidone 50/'25mg'$  daily w/ adequate control.  No CP, SOB, HAs, visual changes, edema. ? ?Hyperlipidemia- chronic problem, on Crestor '20mg'$  nightly.  [facility]  No abd pain, N/V. ? ?Hypothyroid- chronic problem, on Levothyroxine 45mg daily.  Pt reports energy level is stable.  No changes to skin/hair/nails. ? ? ?Review of Systems ?For ROS see HPI  ?   ?Objective:  ? Physical Exam ?Vitals reviewed.  ?Constitutional:   ?   General: She is not in acute distress. ?   Appearance: Normal appearance. She is well-developed. She is not ill-appearing.  ?HENT:  ?   Head: Normocephalic and atraumatic.  ?Eyes:  ?   Conjunctiva/sclera: Conjunctivae normal.  ?   Pupils: Pupils are equal, round, and reactive to light.  ?Neck:  ?   Thyroid: No thyromegaly.  ?Cardiovascular:  ?   Rate and Rhythm: Normal rate and regular rhythm.  ?   Pulses: Normal pulses.  ?   Heart sounds: Normal heart sounds. No murmur heard. ?Pulmonary:  ?   Effort: Pulmonary effort is normal. No respiratory distress.  ?   Breath sounds: Normal breath sounds.  ?Abdominal:  ?   General: There is no distension.  ?   Palpations: Abdomen is soft.  ?   Tenderness: There is no abdominal tenderness.  ?Musculoskeletal:  ?   Cervical back: Normal range of motion and neck supple.  ?   Right lower leg: No edema.  ?   Left lower leg: No edema.  ?Lymphadenopathy:  ?   Cervical: No cervical adenopathy.  ?Skin: ?   General: Skin is warm and dry.  ?Neurological:  ?   Mental Status: She is alert and oriented to person, place, and time.  ?Psychiatric:     ?   Behavior: Behavior normal.  ? ? ? ? ? ?   ?Assessment & Plan:  ? ? ?

## 2021-07-03 ENCOUNTER — Other Ambulatory Visit: Payer: Self-pay | Admitting: Family Medicine

## 2021-07-11 ENCOUNTER — Ambulatory Visit
Admission: RE | Admit: 2021-07-11 | Discharge: 2021-07-11 | Disposition: A | Discharge status: Home / Self Care | Payer: Medicare HMO | Source: Ambulatory Visit | Attending: Family Medicine | Admitting: Family Medicine

## 2021-07-11 DIAGNOSIS — Z1231 Encounter for screening mammogram for malignant neoplasm of breast: Secondary | ICD-10-CM

## 2021-07-12 ENCOUNTER — Other Ambulatory Visit: Payer: Self-pay | Admitting: Family Medicine

## 2021-07-12 DIAGNOSIS — R928 Other abnormal and inconclusive findings on diagnostic imaging of breast: Secondary | ICD-10-CM

## 2021-07-13 ENCOUNTER — Telehealth: Payer: Self-pay

## 2021-07-13 NOTE — Telephone Encounter (Signed)
Received a form from the [facility] for DR Tabori to sign . I placed it in her folder to sign . Once signed I will fax back .

## 2021-07-17 ENCOUNTER — Telehealth: Payer: Self-pay

## 2021-07-17 NOTE — Telephone Encounter (Signed)
[facility] @ 950 am

## 2021-07-19 ENCOUNTER — Ambulatory Visit
Admission: RE | Admit: 2021-07-19 | Discharge: 2021-07-19 | Disposition: A | Discharge status: Home / Self Care | Payer: Medicare HMO | Source: Ambulatory Visit | Attending: Family Medicine | Admitting: Family Medicine

## 2021-07-19 ENCOUNTER — Ambulatory Visit: Admission: RE | Admit: 2021-07-19 | Payer: Medicare HMO | Source: Ambulatory Visit

## 2021-07-19 DIAGNOSIS — R928 Other abnormal and inconclusive findings on diagnostic imaging of breast: Secondary | ICD-10-CM

## 2021-07-19 DIAGNOSIS — N6489 Other specified disorders of breast: Secondary | ICD-10-CM | POA: Diagnosis not present

## 2021-08-08 ENCOUNTER — Other Ambulatory Visit: Payer: Self-pay | Admitting: Family Medicine

## 2021-09-01 ENCOUNTER — Other Ambulatory Visit: Payer: Self-pay

## 2021-09-01 ENCOUNTER — Telehealth: Payer: Self-pay | Admitting: Family Medicine

## 2021-09-01 MED ORDER — ATENOLOL-CHLORTHALIDONE 50-25 MG PO TABS: 1.0000 | ORAL_TABLET | Freq: Every day | ORAL | 0 refills | Status: DC

## 2021-09-01 NOTE — Telephone Encounter (Signed)
Caller name: Rubylee (pt)  On DPR? :yes/no: Yes  Call back number: [phone]  Provider they see: Dr. Birdie Riddle  Reason for call: Pt traveling to [place] 8/23 and won't be back until 10/23.  She will run out of her atenolol-chlorthalidone 50-25 mg while she is gone by 15 pills.  Wondering if we can call in enough to last during her trip.

## 2021-09-01 NOTE — Telephone Encounter (Signed)
Refill sent to pharmacy.   

## 2021-09-21 ENCOUNTER — Ambulatory Visit (INDEPENDENT_AMBULATORY_CARE_PROVIDER_SITE_OTHER): Payer: Medicare HMO

## 2021-09-21 DIAGNOSIS — Z0000 Encounter for general adult medical examination without abnormal findings: Secondary | ICD-10-CM

## 2021-09-21 NOTE — Patient Instructions (Signed)
Mary Cohen , Thank you for taking time to come for your Medicare Wellness Visit. I appreciate your ongoing commitment to your health goals. Please review the following plan we discussed and let me know if I can assist you in the future.   Screening recommendations/referrals: Colonoscopy: Cologuard 04/16/2019 Mammogram: 07/11/2021 Bone Density: 02/22/2020 Recommended yearly ophthalmology/optometry visit for glaucoma screening and checkup Recommended yearly dental visit for hygiene and checkup  Vaccinations: Influenza vaccine: completed  Pneumococcal vaccine: completed  Tdap vaccine: 02/05/2013 Shingles vaccine: will consider     Advanced directives: none  Conditions/risks identified: none  Next appointment: none   Preventive Care 37 Years and Older, Female Preventive care refers to lifestyle choices and visits with your health care provider that can promote health and wellness. What does preventive care include? A yearly physical exam. This is also called an annual well check. Dental exams once or twice a year. Routine eye exams. Ask your health care provider how often you should have your eyes checked. Personal lifestyle choices, including: Daily care of your teeth and gums. Regular physical activity. Eating a healthy diet. Avoiding tobacco and drug use. Limiting alcohol use. Practicing safe sex. Taking low-dose aspirin every day. Taking vitamin and mineral supplements as recommended by your health care provider. What happens during an annual well check? The services and screenings done by your health care provider during your annual well check will depend on your age, overall health, lifestyle risk factors, and family history of disease. Counseling  Your health care provider may ask you questions about your: Alcohol use. Tobacco use. Drug use. Emotional well-being. Home and relationship well-being. Sexual activity. Eating habits. History of falls. Memory and  ability to understand (cognition). Work and work Statistician. Reproductive health. Screening  You may have the following tests or measurements: Height, weight, and BMI. Blood pressure. Lipid and cholesterol levels. These may be checked every 5 years, or more frequently if you are over 80 years old. Skin check. Lung cancer screening. You may have this screening every year starting at age 30 if you have a 30-pack-year history of smoking and currently smoke or have quit within the past 15 years. Fecal occult blood test (FOBT) of the stool. You may have this test every year starting at age 16. Flexible sigmoidoscopy or colonoscopy. You may have a sigmoidoscopy every 5 years or a colonoscopy every 10 years starting at age 30. Hepatitis C blood test. Hepatitis B blood test. Sexually transmitted disease (STD) testing. Diabetes screening. This is done by checking your blood sugar (glucose) after you have not eaten for a while (fasting). You may have this done every 1-3 years. Bone density scan. This is done to screen for osteoporosis. You may have this done starting at age 2. Mammogram. This may be done every 1-2 years. Talk to your health care provider about how often you should have regular mammograms. Talk with your health care provider about your test results, treatment options, and if necessary, the need for more tests. Vaccines  Your health care provider may recommend certain vaccines, such as: Influenza vaccine. This is recommended every year. Tetanus, diphtheria, and acellular pertussis (Tdap, Td) vaccine. You may need a Td booster every 10 years. Zoster vaccine. You may need this after age 61. Pneumococcal 13-valent conjugate (PCV13) vaccine. One dose is recommended after age 76. Pneumococcal polysaccharide (PPSV23) vaccine. One dose is recommended after age 46. Talk to your health care provider about which screenings and vaccines you need and how often you need  them. This information is  not intended to replace advice given to you by your health care provider. Make sure you discuss any questions you have with your health care provider. Document Released: 02/18/2015 Document Revised: 10/12/2015 Document Reviewed: 11/23/2014 Elsevier Interactive Patient Education  2017 [place] Prevention in the Home Falls can cause injuries. They can happen to people of all ages. There are many things you can do to make your home safe and to help prevent falls. What can I do on the outside of my home? Regularly fix the edges of walkways and driveways and fix any cracks. Remove anything that might make you trip as you walk through a door, such as a raised step or threshold. Trim any bushes or trees on the path to your home. Use bright outdoor lighting. Clear any walking paths of anything that might make someone trip, such as rocks or tools. Regularly check to see if handrails are loose or broken. Make sure that both sides of any steps have handrails. Any raised decks and porches should have guardrails on the edges. Have any leaves, snow, or ice cleared regularly. Use sand or salt on walking paths during winter. Clean up any spills in your garage right away. This includes oil or grease spills. What can I do in the bathroom? Use night lights. Install grab bars by the toilet and in the tub and shower. Do not use towel bars as grab bars. Use non-skid mats or decals in the tub or shower. If you need to sit down in the shower, use a plastic, non-slip stool. Keep the floor dry. Clean up any water that spills on the floor as soon as it happens. Remove soap buildup in the tub or shower regularly. Attach bath mats securely with double-sided non-slip rug tape. Do not have throw rugs and other things on the floor that can make you trip. What can I do in the bedroom? Use night lights. Make sure that you have a light by your bed that is easy to reach. Do not use any sheets or blankets that  are too big for your bed. They should not hang down onto the floor. Have a firm chair that has side arms. You can use this for support while you get dressed. Do not have throw rugs and other things on the floor that can make you trip. What can I do in the kitchen? Clean up any spills right away. Avoid walking on wet floors. Keep items that you use a lot in easy-to-reach places. If you need to reach something above you, use a strong step stool that has a grab bar. Keep electrical cords out of the way. Do not use floor polish or wax that makes floors slippery. If you must use wax, use non-skid floor wax. Do not have throw rugs and other things on the floor that can make you trip. What can I do with my stairs? Do not leave any items on the stairs. Make sure that there are handrails on both sides of the stairs and use them. Fix handrails that are broken or loose. Make sure that handrails are as long as the stairways. Check any carpeting to make sure that it is firmly attached to the stairs. Fix any carpet that is loose or worn. Avoid having throw rugs at the top or bottom of the stairs. If you do have throw rugs, attach them to the floor with carpet tape. Make sure that you have a light switch at  the top of the stairs and the bottom of the stairs. If you do not have them, ask someone to add them for you. What else can I do to help prevent falls? Wear shoes that: Do not have high heels. Have rubber bottoms. Are comfortable and fit you well. Are closed at the toe. Do not wear sandals. If you use a stepladder: Make sure that it is fully opened. Do not climb a closed stepladder. Make sure that both sides of the stepladder are locked into place. Ask someone to hold it for you, if possible. Clearly mark and make sure that you can see: Any grab bars or handrails. First and last steps. Where the edge of each step is. Use tools that help you move around (mobility aids) if they are needed. These  include: Canes. Walkers. Scooters. Crutches. Turn on the lights when you go into a dark area. Replace any light bulbs as soon as they burn out. Set up your furniture so you have a clear path. Avoid moving your furniture around. If any of your floors are uneven, fix them. If there are any pets around you, be aware of where they are. Review your medicines with your doctor. Some medicines can make you feel dizzy. This can increase your chance of falling. Ask your doctor what other things that you can do to help prevent falls. This information is not intended to replace advice given to you by your health care provider. Make sure you discuss any questions you have with your health care provider. Document Released: 11/18/2008 Document Revised: 06/30/2015 Document Reviewed: 02/26/2014 Elsevier Interactive Patient Education  2017 [employer].

## 2021-09-21 NOTE — Progress Notes (Signed)
Subjective:   Mary Cohen is a 71 y.o. female who presents for Medicare Annual (Subsequent) preventive examination.   I connected with Hester Mates today by telephone and verified that I am speaking with the correct person using two identifiers. Location patient: home Location provider: work Persons participating in the virtual visit: patient, provider.   I discussed the limitations, risks, security and privacy concerns of performing an evaluation and management service by telephone and the availability of in person appointments. I also discussed with the patient that there may be a patient responsible charge related to this service. The patient expressed understanding and verbally consented to this telephonic visit.    Interactive audio and video telecommunications were attempted between this provider and patient, however failed, due to patient having technical difficulties OR patient did not have access to video capability.  We continued and completed visit with audio only.    Review of Systems     Cardiac Risk Factors include: advanced age (>30mn, >>56women)     Objective:    Today's Vitals   There is no height or weight on file to calculate BMI.     09/21/2021   11:33 AM 04/18/2020    2:58 PM 03/25/2019   10:49 AM 04/18/2017    9:31 AM 04/12/2016   10:38 AM  Advanced Directives  Does Patient Have a Medical Advance Directive? No No No No No  Would patient like information on creating a medical advance directive? No - Patient declined No - Patient declined Yes (MAU/Ambulatory/Procedural Areas - Information given) Yes (MAU/Ambulatory/Procedural Areas - Information given) Yes (MAU/Ambulatory/Procedural Areas - Information given)    Current Medications (verified) Outpatient Encounter Medications as of 09/21/2021  Medication Sig   atenolol-chlorthalidone (TENORETIC) 50-25 MG tablet Take 1 tablet by mouth daily.   levothyroxine (SYNTHROID) 25 MCG tablet TAKE 1  TABLET BY MOUTH EVERY DAY BEFORE BREAKFAST   Multiple Vitamin (MULTIVITAMIN) tablet Take 1 tablet by mouth daily.   rosuvastatin (CRESTOR) 20 MG tablet TAKE 1 TABLET BY MOUTH EVERY DAY AT NIGHT   vitamin C (ASCORBIC ACID) 500 MG tablet Take 500 mg by mouth daily.   No facility-administered encounter medications on file as of 09/21/2021.    Allergies (verified) Codeine   History: Past Medical History:  Diagnosis Date   Fibroid 1995   Hypertension 4/96   Ovarian mass 04/1998   right ovarian mass   Thyroid disease 03/1993   hypothyroidism   Tremors of nervous system    Past Surgical History:  Procedure Laterality Date   ABDOMINAL HYSTERECTOMY  07/1996   BILATERAL SALPINGOOPHORECTOMY  05/1998   parasitic fibroid   COLONOSCOPY  11/2006   negative, recheck in 10 years   TONSILLECTOMY AND ADENOIDECTOMY  3rd grade   TUBAL LIGATION Bilateral 1985   VAGINAL DELIVERY  07/1996   secondary to fibroids and adenomyosis, ovaries remain   Family History  Problem Relation Age of Onset   Hypertension Mother    Diabetes Mother    Hyperlipidemia Mother    Atrial fibrillation Mother    Hypertension Father    Stroke Father    Heart disease Father        A fib, CABG in past   Thyroid disease Sister    Social History   Socioeconomic History   Marital status: Married    Spouse name: Not on file   Number of children: Not on file   Years of education: Not on file   Highest education level: Not  on file  Occupational History   Not on file  Tobacco Use   Smoking status: Never   Smokeless tobacco: Never  Vaping Use   Vaping Use: Never used  Substance and Sexual Activity   Alcohol use: Yes    Alcohol/week: 3.0 standard drinks of alcohol    Types: 3 Standard drinks or equivalent per week   Drug use: No   Sexual activity: Yes    Birth control/protection: Surgical    Comment: hysterectomy  Other Topics Concern   Not on file  Social History Narrative   Not on file   Social Determinants  of Health   Financial Resource Strain: Low Risk  (09/21/2021)   Overall Financial Resource Strain (CARDIA)    Difficulty of Paying Living Expenses: Not hard at all  Food Insecurity: No Food Insecurity (09/21/2021)   Hunger Vital Sign    Worried About Running Out of Food in the Last Year: Never true    Ran Out of Food in the Last Year: Never true  Transportation Needs: No Transportation Needs (09/21/2021)   PRAPARE - Hydrologist (Medical): No    Lack of Transportation (Non-Medical): No  Physical Activity: Insufficiently Active (09/21/2021)   Exercise Vital Sign    Days of Exercise per Week: 2 days    Minutes of Exercise per Session: 60 min  Stress: No Stress Concern Present (09/21/2021)   [place]    Feeling of Stress : Not at all  Social Connections: Moderately Integrated (09/21/2021)   Social Connection and Isolation Panel [NHANES]    Frequency of Communication with Friends and Family: Three times a week    Frequency of Social Gatherings with Friends and Family: Three times a week    Attends Religious Services: More than 4 times per year    Active Member of Clubs or Organizations: No    Attends Archivist Meetings: Never    Marital Status: Married    Tobacco Counseling Counseling given: Not Answered   Clinical Intake:  Pre-visit preparation completed: Yes  Pain : No/denies pain     Nutritional Risks: None Diabetes: No  How often do you need to have someone help you when you read instructions, pamphlets, or other written materials from your doctor or pharmacy?: 1 - Never What is the last grade level you completed in school?: college  Diabetic?no   Interpreter Needed?: No  Information entered by :: L.Wilson,LPN   Activities of Daily Living    09/21/2021   11:35 AM 11/23/2020    9:04 AM  In your present state of health, do you have any difficulty performing the  following activities:  Hearing? 0 0  Vision? 0 0  Difficulty concentrating or making decisions? 0 0  Walking or climbing stairs? 0 0  Dressing or bathing? 0 0  Doing errands, shopping? 0 0  Preparing Food and eating ? N   Using the Toilet? N   In the past six months, have you accidently leaked urine? N   Do you have problems with loss of bowel control? N   Managing your Medications? N   Managing your Finances? N   Housekeeping or managing your Housekeeping? N     Patient Care Team: Midge Minium, MD as PCP - General (Family Medicine) Delaney Meigs, MD as Consulting Physician (Gastroenterology)  Indicate any recent Medical Services you may have received from other than [facility] providers in the past  year (date may be approximate).     Assessment:   This is a routine wellness examination for [place].  Hearing/Vision screen Vision Screening - Comments:: Patient to schedule   Dietary issues and exercise activities discussed: Current Exercise Habits: Home exercise routine, Type of exercise: walking;strength training/weights, Time (Minutes): 60, Frequency (Times/Week): 2, Weekly Exercise (Minutes/Week): 120, Intensity: Mild   Goals Addressed   None    Depression Screen    09/21/2021   11:35 AM 09/21/2021   11:31 AM 11/23/2020    9:05 AM 04/18/2020    3:00 PM 03/31/2020    9:01 AM 09/24/2019    8:59 AM 03/25/2019   10:50 AM  PHQ 2/9 Scores  PHQ - 2 Score 0 0 0 1 0 0 0  PHQ- 9 Score   0  0 0     Fall Risk    09/21/2021   11:35 AM 05/24/2021    9:17 AM 11/23/2020    9:05 AM 04/18/2020    2:59 PM 03/31/2020    9:01 AM  [place] in the past year? 0 0 0 0 0  Number falls in past yr: 0 0  0 0  Injury with Fall? 0 0  0 0  Risk for fall due to :  No Fall Risks No Fall Risks  No Fall Risks  Follow up Falls evaluation completed;Education provided Falls evaluation completed Falls evaluation completed Falls prevention discussed     FALL RISK PREVENTION PERTAINING TO  THE HOME:  Any stairs in or around the home? Yes  If so, are there any without handrails? Yes  Home free of loose throw rugs in walkways, pet beds, electrical cords, etc? Yes  Adequate lighting in your home to reduce risk of falls? Yes   ASSISTIVE DEVICES UTILIZED TO PREVENT FALLS:  Life alert? No  Use of a cane, walker or w/c? No  Grab bars in the bathroom? No  Shower chair or bench in shower? Yes  Elevated toilet seat or a handicapped toilet? No     Cognitive Function:    Normal cognitive status assessed by telephone conversation  by this Nurse Health Advisor. No abnormalities found.      09/21/2021   11:36 AM  6CIT Screen  What Year? 0 points  What month? 0 points  What time? 0 points  Count back from 20 0 points  Months in reverse 0 points  Repeat phrase 0 points  Total Score 0 points    Immunizations Immunization History  Administered Date(s) Administered   DTaP 08/13/2011   Fluad Quad(high Dose 65+) 01/18/2020, 11/23/2020   Influenza, High Dose Seasonal PF 11/01/2017, 12/09/2018   Influenza,inj,Quad PF,6+ Mos 10/18/2015, 10/15/2016   Influenza-Unspecified 11/06/2014   Moderna Sars-Covid-2 Vaccination 03/09/2019, 04/07/2019, 12/08/2019   Pneumococcal Conjugate-13 04/12/2016   Pneumococcal Polysaccharide-23 04/18/2017   Td 02/05/2013    TDAP status: Up to date  Flu Vaccine status: Up to date  Pneumococcal vaccine status: Up to date  Covid-19 vaccine status: Completed vaccines  Qualifies for Shingles Vaccine? Yes   Zostavax completed No   Shingrix Completed?: No.    Education has been provided regarding the importance of this vaccine. Patient has been advised to call insurance company to determine out of pocket expense if they have not yet received this vaccine. Advised may also receive vaccine at local pharmacy or Health Dept. Verbalized acceptance and understanding.  Screening Tests Health Maintenance  Topic Date Due   Zoster Vaccines- Shingrix (1 of  2) Never done   COVID-19 Vaccine (4 - Moderna series) 02/02/2020   INFLUENZA VACCINE  09/05/2021   Fecal DNA (Cologuard)  04/16/2022   MAMMOGRAM  07/12/2022   TETANUS/TDAP  02/06/2023   Pneumonia Vaccine 31+ Years old  Completed   DEXA SCAN  Completed   Hepatitis C Screening  Completed   HPV VACCINES  Aged Out   COLONOSCOPY (Pts 45-28yr Insurance coverage will need to be confirmed)  DSunbrightMaintenance Due  Topic Date Due   Zoster Vaccines- Shingrix (1 of 2) Never done   COVID-19 Vaccine (4 - Moderna series) 02/02/2020   INFLUENZA VACCINE  09/05/2021    Colorectal cancer screening: Type of screening: Cologuard. Completed 04/16/2019. Repeat every 3 years  Mammogram status: Completed 07/11/2021. Repeat every year  Bone Density status: Completed 12/22/2020. Results reflect: Bone density results: OSTEOPENIA. Repeat every 5 years.  Lung Cancer Screening: (Low Dose CT Chest recommended if Age 71-80years, 30 pack-year currently smoking OR have quit w/in 15years.) does not qualify.   Lung Cancer Screening Referral: n/a  Additional Screening:  Hepatitis C Screening: does not qualify;   Vision Screening: Recommended annual ophthalmology exams for early detection of glaucoma and other disorders of the eye. Is the patient up to date with their annual eye exam?  No  Who is the provider or what is the name of the office in which the patient attends annual eye exams? Patient to schedule  If pt is not established with a provider, would they like to be referred to a provider to establish care? No .   Dental Screening: Recommended annual dental exams for proper oral hygiene  Community Resource Referral / Chronic Care Management: CRR required this visit?  No   CCM required this visit?  No      Plan:     I have personally reviewed and noted the following in the patient's chart:   Medical and social history Use of alcohol, tobacco or illicit  drugs  Current medications and supplements including opioid prescriptions.  Functional ability and status Nutritional status Physical activity Advanced directives List of other physicians Hospitalizations, surgeries, and ER visits in previous 12 months Vitals Screenings to include cognitive, depression, and falls Referrals and appointments  In addition, I have reviewed and discussed with patient certain preventive protocols, quality metrics, and best practice recommendations. A written personalized care plan for preventive services as well as general preventive health recommendations were provided to patient.     LDaphane Shepherd LPN   [id number]  Nurse Notes: none

## 2021-11-03 ENCOUNTER — Other Ambulatory Visit: Payer: Self-pay | Admitting: Family Medicine

## 2021-11-05 ENCOUNTER — Other Ambulatory Visit: Payer: Self-pay | Admitting: Family Medicine

## 2021-11-20 ENCOUNTER — Other Ambulatory Visit: Payer: Self-pay | Admitting: Family Medicine

## 2021-12-07 ENCOUNTER — Encounter: Payer: Self-pay | Admitting: Family Medicine

## 2021-12-07 ENCOUNTER — Ambulatory Visit (INDEPENDENT_AMBULATORY_CARE_PROVIDER_SITE_OTHER): Payer: Medicare HMO | Admitting: Family Medicine

## 2021-12-07 VITALS — BP 134/70 | HR 52 | Temp 97.5°F | Ht 64.25 in | Wt 154.6 lb

## 2021-12-07 DIAGNOSIS — Z0000 Encounter for general adult medical examination without abnormal findings: Secondary | ICD-10-CM

## 2021-12-07 DIAGNOSIS — M8580 Other specified disorders of bone density and structure, unspecified site: Secondary | ICD-10-CM | POA: Diagnosis not present

## 2021-12-07 DIAGNOSIS — I10 Essential (primary) hypertension: Secondary | ICD-10-CM

## 2021-12-07 DIAGNOSIS — Z23 Encounter for immunization: Secondary | ICD-10-CM | POA: Diagnosis not present

## 2021-12-07 LAB — BASIC METABOLIC PANEL
BUN: 14 mg/dL (ref 6–23)
CO2: 34 mEq/L — ABNORMAL HIGH (ref 19–32)
Calcium: 10 mg/dL (ref 8.4–10.5)
Chloride: 99 mEq/L (ref 96–112)
Creatinine, Ser: 0.77 mg/dL (ref 0.40–1.20)
GFR: 77.7 mL/min (ref >60.00)
Glucose, Bld: 111 mg/dL — ABNORMAL HIGH (ref 70–99)
Potassium: 3.8 mEq/L (ref 3.5–5.1)
Sodium: 142 mEq/L (ref 135–145)

## 2021-12-07 LAB — HEPATIC FUNCTION PANEL
ALT: 28 U/L (ref 0–35)
AST: 32 U/L (ref 0–37)
Albumin: 4.4 g/dL (ref 3.5–5.2)
Alkaline Phosphatase: 62 U/L (ref 39–117)
Bilirubin, Direct: 0.2 mg/dL (ref 0.0–0.3)
Total Bilirubin: 0.9 mg/dL (ref 0.2–1.2)
Total Protein: 7.6 g/dL (ref 6.0–8.3)

## 2021-12-07 LAB — LIPID PANEL
Cholesterol: 165 mg/dL (ref 0–200)
HDL: 55.5 mg/dL (ref >39.00)
NonHDL: 109.49
Total CHOL/HDL Ratio: 3
Triglycerides: 265 mg/dL — ABNORMAL HIGH (ref 0.0–149.0)
VLDL: 53 mg/dL — ABNORMAL HIGH (ref 0.0–40.0)

## 2021-12-07 LAB — VITAMIN D 25 HYDROXY (VIT D DEFICIENCY, FRACTURES): VITD: 34.82 ng/mL (ref 30.00–100.00)

## 2021-12-07 LAB — CBC WITH DIFFERENTIAL/PLATELET
Basophils Absolute: 0 10*3/uL (ref 0.0–0.1)
Basophils Relative: 0.2 % (ref 0.0–3.0)
Eosinophils Absolute: 0.1 10*3/uL (ref 0.0–0.7)
Eosinophils Relative: 1.7 % (ref 0.0–5.0)
HCT: 44.9 % (ref 36.0–46.0)
Hemoglobin: 15.4 g/dL — ABNORMAL HIGH (ref 12.0–15.0)
Lymphocytes Relative: 30 % (ref 12.0–46.0)
Lymphs Abs: 2.1 10*3/uL (ref 0.7–4.0)
MCHC: 34.3 g/dL (ref 30.0–36.0)
MCV: 94.4 fl (ref 78.0–100.0)
Monocytes Absolute: 0.8 10*3/uL (ref 0.1–1.0)
Monocytes Relative: 11.6 % (ref 3.0–12.0)
Neutro Abs: 3.9 10*3/uL (ref 1.4–7.7)
Neutrophils Relative %: 56.5 % (ref 43.0–77.0)
Platelets: 217 10*3/uL (ref 150.0–400.0)
RBC: 4.76 Mil/uL (ref 3.87–5.11)
RDW: 13.6 % (ref 11.5–15.5)
WBC: 7 10*3/uL (ref 4.0–10.5)

## 2021-12-07 LAB — TSH: TSH: 5.16 u[IU]/mL (ref 0.35–5.50)

## 2021-12-07 LAB — LDL CHOLESTEROL, DIRECT: Direct LDL: 84 mg/dL

## 2021-12-07 NOTE — Assessment & Plan Note (Signed)
Chronic problem.  Adequate control.  Currently asymptomatic.  Check labs.  No anticipated med changes. 

## 2021-12-07 NOTE — Patient Instructions (Signed)
Follow up in 6 months to recheck BP and thyroid We'll notify you of your lab results and make any changes if needed Keep up the good work on healthy diet and regular exercise- you're doing great!!! Call with any questions or concerns Stay Safe!  Stay Healthy! Happy Fall!!!

## 2021-12-07 NOTE — Assessment & Plan Note (Signed)
Check Vit D level and replete prn.

## 2021-12-07 NOTE — Assessment & Plan Note (Signed)
Pt's PE WNL.  UTD on cologuard, mammo, PNA, Tdap.  Flu shot given.  Check labs.  Anticipatory guidance provided.

## 2021-12-07 NOTE — Progress Notes (Signed)
   Subjective:    Patient ID: Mary Cohen, female    DOB: 01/12/1951, 71 y.o.   MRN: [id number]  HPI CPE- due for flu.  UTD on cologuard, mammo, Tdap, PNA.  Patient Care Team    Relationship Specialty Notifications Start End  Midge Minium, MD PCP - General Family Medicine  08/26/12   Delaney Meigs, MD Consulting Physician Gastroenterology  03/01/15     Health Maintenance  Topic Date Due   INFLUENZA VACCINE  09/05/2021   COVID-19 Vaccine (4 - Moderna series) 12/23/2021 (Originally 02/02/2020)   Zoster Vaccines- Shingrix (1 of 2) 03/09/2022 (Originally 09/02/2000)   Fecal DNA (Cologuard)  04/16/2022   MAMMOGRAM  07/12/2022   Medicare Annual Wellness (AWV)  09/22/2022   TETANUS/TDAP  02/06/2023   Pneumonia Vaccine 57+ Years old  Completed   DEXA SCAN  Completed   Hepatitis C Screening  Completed   HPV VACCINES  Aged Out   COLONOSCOPY (Pts 45-79yr Insurance coverage will need to be confirmed)  Discontinued      Review of Systems Patient reports no vision/ hearing changes, adenopathy,fever,  persistant/recurrent hoarseness , swallowing issues, chest pain, palpitations, edema, persistant/recurrent cough, hemoptysis, dyspnea (rest/exertional/paroxysmal nocturnal), gastrointestinal bleeding (melena, rectal bleeding), abdominal pain, significant heartburn, bowel changes, GU symptoms (dysuria, hematuria, incontinence), Gyn symptoms (abnormal  bleeding, pain),  syncope, focal weakness, memory loss, numbness & tingling, skin/hair/nail changes, abnormal bruising or bleeding, anxiety, or depression.   + 10 lb weight loss    Objective:   Physical Exam General Appearance:    Alert, cooperative, no distress, appears stated age  Head:    Normocephalic, without obvious abnormality, atraumatic  Eyes:    PERRL, conjunctiva/corneas clear, EOM's intact both eyes  Ears:    Normal TM's and external ear canals, both ears  Nose:   Nares normal, septum midline, mucosa normal, no drainage     or sinus tenderness  Throat:   Lips, mucosa, and tongue normal; gums normal, poor dentition  Neck:   Supple, symmetrical, trachea midline, no adenopathy;    Thyroid: no enlargement/tenderness/nodules  Back:     Symmetric, no curvature, ROM normal, no CVA tenderness  Lungs:     Clear to auscultation bilaterally, respirations unlabored  Chest Wall:    No tenderness or deformity   Heart:    Regular rate and rhythm, S1 and S2 normal, no murmur, rub   or gallop  Breast Exam:    Deferred to mammo  Abdomen:     Soft, non-tender, bowel sounds active all four quadrants,    no masses, no organomegaly  Genitalia:    Deferred  Rectal:    Extremities:   Extremities normal, atraumatic, no cyanosis or edema  Pulses:   2+ and symmetric all extremities  Skin:   Skin color, texture, turgor normal, no rashes or lesions  Lymph nodes:   Cervical, supraclavicular, and axillary nodes normal  Neurologic:   CNII-XII intact, normal strength, sensation and reflexes    throughout          Assessment & Plan:

## 2022-02-16 ENCOUNTER — Other Ambulatory Visit: Payer: Self-pay | Admitting: Family Medicine

## 2022-02-21 ENCOUNTER — Other Ambulatory Visit: Payer: Self-pay | Admitting: Family Medicine

## 2022-02-23 ENCOUNTER — Telehealth: Payer: Self-pay | Admitting: Family Medicine

## 2022-02-23 ENCOUNTER — Other Ambulatory Visit: Payer: Self-pay

## 2022-02-23 MED ORDER — LEVOTHYROXINE SODIUM 25 MCG PO TABS: ORAL_TABLET | ORAL | 1 refills | Status: DC

## 2022-02-23 MED ORDER — LEVOTHYROXINE SODIUM 25 MCG PO TABS: ORAL_TABLET | ORAL | 0 refills | Status: DC

## 2022-02-23 NOTE — Telephone Encounter (Signed)
Patient's husband called asking that refills be put on levothyroxine so that they wouldn't have to call us as frequently to ask to refills.

## 2022-02-25 ENCOUNTER — Other Ambulatory Visit: Payer: Self-pay | Admitting: Family Medicine

## 2022-05-17 ENCOUNTER — Other Ambulatory Visit: Payer: Self-pay

## 2022-05-17 ENCOUNTER — Other Ambulatory Visit: Payer: Self-pay | Admitting: Family Medicine

## 2022-05-17 DIAGNOSIS — E785 Hyperlipidemia, unspecified: Secondary | ICD-10-CM

## 2022-05-17 MED ORDER — ATENOLOL-CHLORTHALIDONE 50-25 MG PO TABS: 1.0000 | ORAL_TABLET | Freq: Every day | ORAL | 0 refills | Status: DC

## 2022-05-17 MED ORDER — ROSUVASTATIN CALCIUM 20 MG PO TABS: ORAL_TABLET | ORAL | 1 refills | Status: DC

## 2022-05-17 MED ORDER — LEVOTHYROXINE SODIUM 25 MCG PO TABS: ORAL_TABLET | ORAL | 1 refills | Status: DC

## 2022-05-17 NOTE — Telephone Encounter (Signed)
Encourage patient to contact the pharmacy for refills or they can request refills through [facility]   WHAT PHARMACY WOULD THEY LIKE THIS SENT TO:  [id number] - [place], [place] - [address]   MEDICATION NAME & DOSE: rosuvastatin (CRESTOR) 20 MG tablet atenolol-chlorthalidone (TENORETIC) 50-25 MG tablet levothyroxine (SYNTHROID) 25 MCG tablet  NOTES/COMMENTS FROM PATIENT: Looks like Crestor is currently being refilled...      Front office please notify patient: It takes 48-72 hours to process rx refill requests Ask patient to call pharmacy to ensure rx is ready before heading there.

## 2022-05-29 ENCOUNTER — Other Ambulatory Visit: Payer: Self-pay | Admitting: Family Medicine

## 2022-05-29 DIAGNOSIS — Z1231 Encounter for screening mammogram for malignant neoplasm of breast: Secondary | ICD-10-CM

## 2022-06-07 ENCOUNTER — Ambulatory Visit (INDEPENDENT_AMBULATORY_CARE_PROVIDER_SITE_OTHER): Payer: Medicare HMO | Admitting: Family Medicine

## 2022-06-07 ENCOUNTER — Encounter: Payer: Self-pay | Admitting: Family Medicine

## 2022-06-07 VITALS — BP 126/64 | HR 58 | Temp 98.2°F | Ht 64.0 in | Wt 160.0 lb

## 2022-06-07 DIAGNOSIS — I10 Essential (primary) hypertension: Secondary | ICD-10-CM | POA: Diagnosis not present

## 2022-06-07 DIAGNOSIS — E785 Hyperlipidemia, unspecified: Secondary | ICD-10-CM

## 2022-06-07 DIAGNOSIS — E039 Hypothyroidism, unspecified: Secondary | ICD-10-CM

## 2022-06-07 DIAGNOSIS — Z1211 Encounter for screening for malignant neoplasm of colon: Secondary | ICD-10-CM

## 2022-06-07 LAB — HEPATIC FUNCTION PANEL
ALT: 26 U/L (ref 0–35)
AST: 32 U/L (ref 0–37)
Albumin: 4.3 g/dL (ref 3.5–5.2)
Alkaline Phosphatase: 56 U/L (ref 39–117)
Bilirubin, Direct: 0.1 mg/dL (ref 0.0–0.3)
Total Bilirubin: 0.6 mg/dL (ref 0.2–1.2)
Total Protein: 7.5 g/dL (ref 6.0–8.3)

## 2022-06-07 LAB — CBC WITH DIFFERENTIAL/PLATELET
Basophils Absolute: 0 10*3/uL (ref 0.0–0.1)
Basophils Relative: 0.3 % (ref 0.0–3.0)
Eosinophils Absolute: 0.1 10*3/uL (ref 0.0–0.7)
Eosinophils Relative: 0.9 % (ref 0.0–5.0)
HCT: 44.3 % (ref 36.0–46.0)
Hemoglobin: 15.4 g/dL — ABNORMAL HIGH (ref 12.0–15.0)
Lymphocytes Relative: 27.9 % (ref 12.0–46.0)
Lymphs Abs: 1.9 10*3/uL (ref 0.7–4.0)
MCHC: 34.7 g/dL (ref 30.0–36.0)
MCV: 94.4 fl (ref 78.0–100.0)
Monocytes Absolute: 0.7 10*3/uL (ref 0.1–1.0)
Monocytes Relative: 10.2 % (ref 3.0–12.0)
Neutro Abs: 4.1 10*3/uL (ref 1.4–7.7)
Neutrophils Relative %: 60.7 % (ref 43.0–77.0)
Platelets: 211 10*3/uL (ref 150.0–400.0)
RBC: 4.69 Mil/uL (ref 3.87–5.11)
RDW: 13.2 % (ref 11.5–15.5)
WBC: 6.7 10*3/uL (ref 4.0–10.5)

## 2022-06-07 LAB — BASIC METABOLIC PANEL
BUN: 13 mg/dL (ref 6–23)
CO2: 31 mEq/L (ref 19–32)
Calcium: 10.1 mg/dL (ref 8.4–10.5)
Chloride: 99 mEq/L (ref 96–112)
Creatinine, Ser: 0.82 mg/dL (ref 0.40–1.20)
GFR: 71.79 mL/min (ref >60.00)
Glucose, Bld: 102 mg/dL — ABNORMAL HIGH (ref 70–99)
Potassium: 3.8 mEq/L (ref 3.5–5.1)
Sodium: 140 mEq/L (ref 135–145)

## 2022-06-07 LAB — LIPID PANEL
Cholesterol: 132 mg/dL (ref 0–200)
HDL: 55 mg/dL (ref >39.00)
LDL Cholesterol: 54 mg/dL (ref 0–99)
NonHDL: 77.34
Total CHOL/HDL Ratio: 2
Triglycerides: 117 mg/dL (ref 0.0–149.0)
VLDL: 23.4 mg/dL (ref 0.0–40.0)

## 2022-06-07 LAB — TSH: TSH: 4.91 u[IU]/mL (ref 0.35–5.50)

## 2022-06-07 NOTE — Assessment & Plan Note (Signed)
Chronic problem.  Well controlled on Atenolol chlorthalidone 50/25mg .  Currently asymptomatic.  Check labs due to chlorthalidone use but no anticipated med changes.  Will follow.

## 2022-06-07 NOTE — Progress Notes (Signed)
   Subjective:    Patient ID: Mary Cohen, female    DOB: 07-26-1950, 72 y.o.   MRN: [id number]  HPI HTN- chronic problem, on Atenolol chlorthalidone 50/25 w/ good control.  Pt reports feeling well.  No CP, SOB, HA's, visual changes, edema.  Hyperlipidemia- chronic problem, on Crestor 20mg  daily.  No abd pain, N/V.  Hypothyroid- chronic problem, on Levothyroxine daily.  Energy level is good.  No changes to skin/hair/nails.   Review of Systems For ROS see HPI     Objective:   Physical Exam Vitals reviewed.  Constitutional:      General: She is not in acute distress.    Appearance: Normal appearance. She is well-developed. She is not ill-appearing.  HENT:     Head: Normocephalic and atraumatic.  Eyes:     Conjunctiva/sclera: Conjunctivae normal.     Pupils: Pupils are equal, round, and reactive to light.  Neck:     Thyroid: No thyromegaly.  Cardiovascular:     Rate and Rhythm: Normal rate and regular rhythm.     Pulses: Normal pulses.     Heart sounds: Normal heart sounds. No murmur heard. Pulmonary:     Effort: Pulmonary effort is normal. No respiratory distress.     Breath sounds: Normal breath sounds.  Abdominal:     General: There is no distension.     Palpations: Abdomen is soft.     Tenderness: There is no abdominal tenderness.  Musculoskeletal:     Cervical back: Normal range of motion and neck supple.     Right lower leg: No edema.     Left lower leg: No edema.  Lymphadenopathy:     Cervical: No cervical adenopathy.  Skin:    General: Skin is warm and dry.  Neurological:     General: No focal deficit present.     Mental Status: She is alert and oriented to person, place, and time.  Psychiatric:        Mood and Affect: Mood normal.        Behavior: Behavior normal.           Assessment & Plan:

## 2022-06-07 NOTE — Assessment & Plan Note (Signed)
Chronic problem.  Currently on Crestor 20mg daily w/o difficulty.  Check labs.  Adjust meds prn  

## 2022-06-07 NOTE — Assessment & Plan Note (Signed)
Chronic problem.  On Levothyroxine daily.  Asymptomatic at this time.  Check labs.  Adjust meds prn

## 2022-06-07 NOTE — Patient Instructions (Signed)
Schedule your complete physical in 6 months We'll notify you of your lab results and make any changes if needed Keep up the good work on healthy diet and regular exercise- you look great!! Call with any questions or concerns Stay Safe!  Stay Healthy! Have a great summer!!! 

## 2022-06-08 ENCOUNTER — Telehealth: Payer: Self-pay

## 2022-06-08 NOTE — Telephone Encounter (Signed)
-----   Message from Katherine E Tabori, MD sent at 06/08/2022  7:42 AM EDT ----- Labs look great!  No changes at this time 

## 2022-06-08 NOTE — Telephone Encounter (Signed)
Pt seen results Via my chart  

## 2022-06-20 DIAGNOSIS — Z1211 Encounter for screening for malignant neoplasm of colon: Secondary | ICD-10-CM | POA: Diagnosis not present

## 2022-06-26 LAB — COLOGUARD: COLOGUARD: NEGATIVE

## 2022-06-27 ENCOUNTER — Telehealth: Payer: Self-pay

## 2022-06-27 NOTE — Telephone Encounter (Signed)
-----   Message from Sheliah Hatch, MD sent at 06/26/2022  8:30 PM EDT ----- Normal cologuard- great news!

## 2022-06-27 NOTE — Telephone Encounter (Signed)
Pt aware of lab results 

## 2022-07-18 ENCOUNTER — Ambulatory Visit
Admission: RE | Admit: 2022-07-18 | Discharge: 2022-07-18 | Disposition: A | Discharge status: Home / Self Care | Payer: Medicare HMO | Source: Ambulatory Visit | Attending: Family Medicine | Admitting: Family Medicine

## 2022-07-18 DIAGNOSIS — Z1231 Encounter for screening mammogram for malignant neoplasm of breast: Secondary | ICD-10-CM

## 2022-09-05 ENCOUNTER — Encounter (INDEPENDENT_AMBULATORY_CARE_PROVIDER_SITE_OTHER): Payer: Self-pay

## 2022-10-04 ENCOUNTER — Ambulatory Visit (INDEPENDENT_AMBULATORY_CARE_PROVIDER_SITE_OTHER): Payer: Medicare HMO | Admitting: *Deleted

## 2022-10-04 DIAGNOSIS — Z0000 Encounter for general adult medical examination without abnormal findings: Secondary | ICD-10-CM | POA: Diagnosis not present

## 2022-10-04 NOTE — Patient Instructions (Signed)
Mary Cohen , Thank you for taking time to come for your Medicare Wellness Visit. I appreciate your ongoing commitment to your health goals. Please review the following plan we discussed and let me know if I can assist you in the future.   Screening recommendations/referrals: Colonoscopy: up to date Mammogram: up to date Bone Density: up to date Recommended yearly ophthalmology/optometry visit for glaucoma screening and checkup Recommended yearly dental visit for hygiene and checkup  Vaccinations: Influenza vaccine: Education provided Pneumococcal vaccine: up to date Tdap vaccine: Education provided Shingles vaccine: Education provided    Advanced directives: on file    Preventive Care 65 Years and Older, Female Preventive care refers to lifestyle choices and visits with your health care provider that can promote health and wellness. What does preventive care include? A yearly physical exam. This is also called an annual well check. Dental exams once or twice a year. Routine eye exams. Ask your health care provider how often you should have your eyes checked. Personal lifestyle choices, including: Daily care of your teeth and gums. Regular physical activity. Eating a healthy diet. Avoiding tobacco and drug use. Limiting alcohol use. Practicing safe sex. Taking low-dose aspirin every day. Taking vitamin and mineral supplements as recommended by your health care provider. What happens during an annual well check? The services and screenings done by your health care provider during your annual well check will depend on your age, overall health, lifestyle risk factors, and family history of disease. Counseling  Your health care provider may ask you questions about your: Alcohol use. Tobacco use. Drug use. Emotional well-being. Home and relationship well-being. Sexual activity. Eating habits. History of falls. Memory and ability to understand (cognition). Work and work  Astronomer. Reproductive health. Screening  You may have the following tests or measurements: Height, weight, and BMI. Blood pressure. Lipid and cholesterol levels. These may be checked every 5 years, or more frequently if you are over 80 years old. Skin check. Lung cancer screening. You may have this screening every year starting at age 75 if you have a 30-pack-year history of smoking and currently smoke or have quit within the past 15 years. Fecal occult blood test (FOBT) of the stool. You may have this test every year starting at age 28. Flexible sigmoidoscopy or colonoscopy. You may have a sigmoidoscopy every 5 years or a colonoscopy every 10 years starting at age 72. Hepatitis C blood test. Hepatitis B blood test. Sexually transmitted disease (STD) testing. Diabetes screening. This is done by checking your blood sugar (glucose) after you have not eaten for a while (fasting). You may have this done every 1-3 years. Bone density scan. This is done to screen for osteoporosis. You may have this done starting at age 15. Mammogram. This may be done every 1-2 years. Talk to your health care provider about how often you should have regular mammograms. Talk with your health care provider about your test results, treatment options, and if necessary, the need for more tests. Vaccines  Your health care provider may recommend certain vaccines, such as: Influenza vaccine. This is recommended every year. Tetanus, diphtheria, and acellular pertussis (Tdap, Td) vaccine. You may need a Td booster every 10 years. Zoster vaccine. You may need this after age 83. Pneumococcal 13-valent conjugate (PCV13) vaccine. One dose is recommended after age 84. Pneumococcal polysaccharide (PPSV23) vaccine. One dose is recommended after age 102. Talk to your health care provider about which screenings and vaccines you need and how often you need  them. This information is not intended to replace advice given to you by  your health care provider. Make sure you discuss any questions you have with your health care provider. Document Released: 02/18/2015 Document Revised: 10/12/2015 Document Reviewed: 11/23/2014 Elsevier Interactive Patient Education  2017 [employer].  Fall Prevention in the Home Falls can cause injuries. They can happen to people of all ages. There are many things you can do to make your home safe and to help prevent falls. What can I do on the outside of my home? Regularly fix the edges of walkways and driveways and fix any cracks. Remove anything that might make you trip as you walk through a door, such as a raised step or threshold. Trim any bushes or trees on the path to your home. Use bright outdoor lighting. Clear any walking paths of anything that might make someone trip, such as rocks or tools. Regularly check to see if handrails are loose or broken. Make sure that both sides of any steps have handrails. Any raised decks and porches should have guardrails on the edges. Have any leaves, snow, or ice cleared regularly. Use sand or salt on walking paths during winter. Clean up any spills in your garage right away. This includes oil or grease spills. What can I do in the bathroom? Use night lights. Install grab bars by the toilet and in the tub and shower. Do not use towel bars as grab bars. Use non-skid mats or decals in the tub or shower. If you need to sit down in the shower, use a plastic, non-slip stool. Keep the floor dry. Clean up any water that spills on the floor as soon as it happens. Remove soap buildup in the tub or shower regularly. Attach bath mats securely with double-sided non-slip rug tape. Do not have throw rugs and other things on the floor that can make you trip. What can I do in the bedroom? Use night lights. Make sure that you have a light by your bed that is easy to reach. Do not use any sheets or blankets that are too big for your bed. They should not hang  down onto the floor. Have a firm chair that has side arms. You can use this for support while you get dressed. Do not have throw rugs and other things on the floor that can make you trip. What can I do in the kitchen? Clean up any spills right away. Avoid walking on wet floors. Keep items that you use a lot in easy-to-reach places. If you need to reach something above you, use a strong step stool that has a grab bar. Keep electrical cords out of the way. Do not use floor polish or wax that makes floors slippery. If you must use wax, use non-skid floor wax. Do not have throw rugs and other things on the floor that can make you trip. What can I do with my stairs? Do not leave any items on the stairs. Make sure that there are handrails on both sides of the stairs and use them. Fix handrails that are broken or loose. Make sure that handrails are as long as the stairways. Check any carpeting to make sure that it is firmly attached to the stairs. Fix any carpet that is loose or worn. Avoid having throw rugs at the top or bottom of the stairs. If you do have throw rugs, attach them to the floor with carpet tape. Make sure that you have a light switch at  the top of the stairs and the bottom of the stairs. If you do not have them, ask someone to add them for you. What else can I do to help prevent falls? Wear shoes that: Do not have high heels. Have rubber bottoms. Are comfortable and fit you well. Are closed at the toe. Do not wear sandals. If you use a stepladder: Make sure that it is fully opened. Do not climb a closed stepladder. Make sure that both sides of the stepladder are locked into place. Ask someone to hold it for you, if possible. Clearly mark and make sure that you can see: Any grab bars or handrails. First and last steps. Where the edge of each step is. Use tools that help you move around (mobility aids) if they are needed. These  include: Canes. Walkers. Scooters. Crutches. Turn on the lights when you go into a dark area. Replace any light bulbs as soon as they burn out. Set up your furniture so you have a clear path. Avoid moving your furniture around. If any of your floors are uneven, fix them. If there are any pets around you, be aware of where they are. Review your medicines with your doctor. Some medicines can make you feel dizzy. This can increase your chance of falling. Ask your doctor what other things that you can do to help prevent falls. This information is not intended to replace advice given to you by your health care provider. Make sure you discuss any questions you have with your health care provider. Document Released: 11/18/2008 Document Revised: 06/30/2015 Document Reviewed: 02/26/2014 Elsevier Interactive Patient Education  2017 [employer].

## 2022-10-04 NOTE — Progress Notes (Signed)
Subjective:   Mary Cohen is a 72 y.o. female who presents for Medicare Annual (Subsequent) preventive examination.  Visit Complete: Virtual  I connected with  [name] on 10/04/22 by a audio enabled telemedicine application and verified that I am speaking with the correct person using two identifiers.  Patient Location: Home  Provider Location: Home Office  I discussed the limitations of evaluation and management by telemedicine. The patient expressed understanding and agreed to proceed.  Patient Medicare AWV questionnaire was completed   Vital Signs: Unable to obtain new vitals due to this being a telehealth visit.   Review of Systems     Cardiac Risk Factors include: advanced age (>59men, >43 women);family history of premature cardiovascular disease;hypertension     Objective:    There were no vitals filed for this visit. There is no height or weight on file to calculate BMI.     10/04/2022    9:37 AM 09/21/2021   11:33 AM 04/18/2020    2:58 PM 03/25/2019   10:49 AM 04/18/2017    9:31 AM 04/12/2016   10:38 AM  Advanced Directives  Does Patient Have a Medical Advance Directive? Yes No No No No No  Type of Science writer of Healthcare Power of Attorney in Chart? Yes - validated most recent copy scanned in chart (See row information)       Would patient like information on creating a medical advance directive?  No - Patient declined No - Patient declined Yes (MAU/Ambulatory/Procedural Areas - Information given) Yes (MAU/Ambulatory/Procedural Areas - Information given) Yes (MAU/Ambulatory/Procedural Areas - Information given)    Current Medications (verified) Outpatient Encounter Medications as of 10/04/2022  Medication Sig   atenolol-chlorthalidone (TENORETIC) 50-25 MG tablet Take 1 tablet by mouth daily.   levothyroxine (SYNTHROID) 25 MCG tablet TAKE 1 TABLET BY MOUTH EVERY DAY BEFORE BREAKFAST    Multiple Vitamin (MULTIVITAMIN) tablet Take 1 tablet by mouth daily.   rosuvastatin (CRESTOR) 20 MG tablet TAKE 1 TABLET BY MOUTH EVERY DAY AT NIGHT   vitamin C (ASCORBIC ACID) 500 MG tablet Take 500 mg by mouth daily.   No facility-administered encounter medications on file as of 10/04/2022.    Allergies (verified) Codeine   History: Past Medical History:  Diagnosis Date   Fibroid 1995   Hypertension 4/96   Ovarian mass 04/1998   right ovarian mass   Thyroid disease 03/1993   hypothyroidism   Tremors of nervous system    Past Surgical History:  Procedure Laterality Date   ABDOMINAL HYSTERECTOMY  07/1996   BILATERAL SALPINGOOPHORECTOMY  05/1998   parasitic fibroid   COLONOSCOPY  11/2006   negative, recheck in 10 years   TONSILLECTOMY AND ADENOIDECTOMY  3rd grade   TUBAL LIGATION Bilateral 1985   VAGINAL DELIVERY  07/1996   secondary to fibroids and adenomyosis, ovaries remain   Family History  Problem Relation Age of Onset   Hypertension Mother    Diabetes Mother    Hyperlipidemia Mother    Atrial fibrillation Mother    Hypertension Father    Stroke Father    Heart disease Father        A fib, CABG in past   Thyroid disease Sister    Social History   Socioeconomic History   Marital status: Married    Spouse name: Not on file   Number of children: Not on file   Years of education: Not on file  Highest education level: Not on file  Occupational History   Not on file  Tobacco Use   Smoking status: Never   Smokeless tobacco: Never  Vaping Use   Vaping status: Never Used  Substance and Sexual Activity   Alcohol use: Yes    Alcohol/week: 3.0 standard drinks of alcohol    Types: 3 Standard drinks or equivalent per week   Drug use: No   Sexual activity: Yes    Birth control/protection: Surgical    Comment: hysterectomy  Other Topics Concern   Not on file  Social History Narrative   Not on file   Social Determinants of Health   Financial Resource Strain:  Low Risk  (10/04/2022)   Overall Financial Resource Strain (CARDIA)    Difficulty of Paying Living Expenses: Not hard at all  Food Insecurity: No Food Insecurity (10/04/2022)   Hunger Vital Sign    Worried About Running Out of Food in the Last Year: Never true    Ran Out of Food in the Last Year: Never true  Transportation Needs: No Transportation Needs (10/04/2022)   PRAPARE - Administrator, Civil Service (Medical): No    Lack of Transportation (Non-Medical): No  Physical Activity: Inactive (10/04/2022)   Exercise Vital Sign    Days of Exercise per Week: 0 days    Minutes of Exercise per Session: 0 min  Stress: No Stress Concern Present (10/04/2022)   [employer] of Occupational Health - Occupational Stress Questionnaire    Feeling of Stress : Not at all  Social Connections: Moderately Integrated (10/04/2022)   Social Connection and Isolation Panel [NHANES]    Frequency of Communication with Friends and Family: More than three times a week    Frequency of Social Gatherings with Friends and Family: Twice a week    Attends Religious Services: More than 4 times per year    Active Member of [employer] or Organizations: No    Attends Engineer, structural: Never    Marital Status: Married    Tobacco Counseling Counseling given: Not Answered   Clinical Intake:  Pre-visit preparation completed: Yes  Pain : No/denies pain     Diabetes: No  How often do you need to have someone help you when you read instructions, pamphlets, or other written materials from your doctor or pharmacy?: 1 - Never  Interpreter Needed?: No  Information entered by :: Remi Haggard LPN   Activities of Daily Living    10/04/2022    9:45 AM  In your present state of health, do you have any difficulty performing the following activities:  Hearing? 1  Vision? 0  Difficulty concentrating or making decisions? 0  Walking or climbing stairs? 0  Dressing or bathing? 0  Doing errands,  shopping? 0  Preparing Food and eating ? N  Using the Toilet? N  In the past six months, have you accidently leaked urine? N  Do you have problems with loss of bowel control? N  Managing your Medications? N  Managing your Finances? N  Housekeeping or managing your Housekeeping? N    Patient Care Team: Sheliah Hatch, MD as PCP - General (Family Medicine) Luvenia Redden, MD as Consulting Physician (Gastroenterology)  Indicate any recent Medical Services you may have received from other than [facility] providers in the past year (date may be approximate).     Assessment:   This is a routine wellness examination for [place].  Hearing/Vision screen Hearing Screening - Comments:: Bilateral hearing  aids Vision Screening - Comments:: Not up date [facility]  Dietary issues and exercise activities discussed:     Goals Addressed             This Visit's Progress    Increase physical activity         Depression Screen    10/04/2022    9:43 AM 06/07/2022    7:56 AM 12/07/2021    7:55 AM 12/07/2021    7:52 AM 09/21/2021   11:35 AM 09/21/2021   11:31 AM 11/23/2020    9:05 AM  PHQ 2/9 Scores  PHQ - 2 Score 0 0 0 0 0 0 0  PHQ- 9 Score 0 0 0    0    Fall Risk    10/04/2022   10:00 AM 06/07/2022    7:56 AM 12/07/2021    7:51 AM 09/21/2021   11:35 AM 05/24/2021    9:17 AM  Fall Risk   Falls in the past year? 0 0 0 0 0  Number falls in past yr: 0 0 0 0 0  Injury with Fall? 0 0 0 0 0  Risk for fall due to :  No Fall Risks No Fall Risks  No Fall Risks  Follow up Falls evaluation completed;Education provided;Falls prevention discussed Falls evaluation completed Falls evaluation completed Falls evaluation completed;Education provided Falls evaluation completed    MEDICARE RISK AT HOME: Medicare Risk at Home Any stairs in or around the home?: Yes If so, are there any without handrails?: No Home free of loose throw rugs in walkways, pet beds, electrical cords, etc?: Yes Adequate lighting  in your home to reduce risk of falls?: Yes Life alert?: No Use of a cane, walker or w/c?: No Grab bars in the bathroom?: No Shower chair or bench in shower?: Yes Elevated toilet seat or a handicapped toilet?: No  TIMED UP AND GO:  Was the test performed?  No    Cognitive Function:        10/04/2022    9:40 AM 09/21/2021   11:36 AM  6CIT Screen  What Year? 0 points 0 points  What month? 0 points 0 points  What time? 0 points 0 points  Count back from 20 0 points 0 points  Months in reverse 0 points 0 points  Repeat phrase 6 points 0 points  Total Score 6 points 0 points    Immunizations Immunization History  Administered Date(s) Administered   DTaP 08/13/2011   Fluad Quad(high Dose 65+) 01/18/2020, 11/23/2020, 12/07/2021   Influenza, High Dose Seasonal PF 11/01/2017, 12/09/2018   Influenza,inj,Quad PF,6+ Mos 10/18/2015, 10/15/2016   Influenza-Unspecified 11/06/2014   Moderna Sars-Covid-2 Vaccination 03/09/2019, 04/07/2019, 12/08/2019   Pneumococcal Conjugate-13 04/12/2016   Pneumococcal Polysaccharide-23 04/18/2017   Td 02/05/2013    TDAP status: Up to date  Flu Vaccine status: Due, Education has been provided regarding the importance of this vaccine. Advised may receive this vaccine at local pharmacy or Health Dept. Aware to provide a copy of the vaccination record if obtained from local pharmacy or Health Dept. Verbalized acceptance and understanding.  Pneumococcal vaccine status: Up to date  Covid-19 vaccine status: Information provided on how to obtain vaccines.   Qualifies for Shingles Vaccine? Yes   Zostavax completed No   Shingrix Completed?: No.    Education has been provided regarding the importance of this vaccine. Patient has been advised to call insurance company to determine out of pocket expense if they have not yet received this vaccine. Advised  may also receive vaccine at local pharmacy or Health Dept. Verbalized acceptance and  understanding.  Screening Tests Health Maintenance  Topic Date Due   INFLUENZA VACCINE  09/06/2022   COVID-19 Vaccine (4 - 2023-24 season) 10/20/2022 (Originally 10/06/2021)   Zoster Vaccines- Shingrix (1 of 2) 01/04/2023 (Originally 09/02/2000)   DTaP/Tdap/Td (3 - Tdap) 02/06/2023   MAMMOGRAM  07/18/2023   Medicare Annual Wellness (AWV)  10/04/2023   Fecal DNA (Cologuard)  06/19/2025   Pneumonia Vaccine 37+ Years old  Completed   DEXA SCAN  Completed   Hepatitis C Screening  Completed   HPV VACCINES  Aged Out   Colonoscopy  Discontinued    Health Maintenance  Health Maintenance Due  Topic Date Due   INFLUENZA VACCINE  09/06/2022    Colorectal cancer screening: Type of screening: Cologuard. Completed 2024. Repeat every 3 years  Mammogram status: Completed  . Repeat every year  Bone Density status: Completed  . Results reflect: Bone density results: NORMAL. Repeat every 5 years.  Lung Cancer Screening: (Low Dose CT Chest recommended if Age 19-80 years, 20 pack-year currently smoking OR have quit w/in 15years.) does not qualify.   Lung Cancer Screening Referral:   Additional Screening:  Hepatitis C Screening: does not qualify; Completed  2018  Vision Screening: Recommended annual ophthalmology exams for early detection of glaucoma and other disorders of the eye. Is the patient up to date with their annual eye exam?  No  Who is the provider or what is the name of the office in which the patient attends annual eye exams? [facility] If pt is not established with a provider, would they like to be referred to a provider to establish care? No .   Dental Screening: Recommended annual dental exams for proper oral hygiene    Community Resource Referral / Chronic Care Management: CRR required this visit?  No   CCM required this visit?  No     Plan:     I have personally reviewed and noted the following in the patient's chart:   Medical and social history Use of alcohol,  tobacco or illicit drugs  Current medications and supplements including opioid prescriptions. Patient is not currently taking opioid prescriptions. Functional ability and status Nutritional status Physical activity Advanced directives List of other physicians Hospitalizations, surgeries, and ER visits in previous 12 months Vitals Screenings to include cognitive, depression, and falls Referrals and appointments  In addition, I have reviewed and discussed with patient certain preventive protocols, quality metrics, and best practice recommendations. A written personalized care plan for preventive services as well as general preventive health recommendations were provided to patient.     Remi Haggard, LPN   [id number]   After Visit Summary: (MyChart) Due to this being a telephonic visit, the after visit summary with patients personalized plan was offered to patient via MyChart   Nurse Notes:

## 2022-10-25 ENCOUNTER — Telehealth: Payer: Self-pay | Admitting: Family Medicine

## 2022-10-25 DIAGNOSIS — E785 Hyperlipidemia, unspecified: Secondary | ICD-10-CM

## 2022-10-25 MED ORDER — ROSUVASTATIN CALCIUM 20 MG PO TABS
ORAL_TABLET | ORAL | 1 refills | Status: DC
Start: 2022-10-25 — End: 2023-04-18

## 2022-10-25 MED ORDER — ATENOLOL-CHLORTHALIDONE 50-25 MG PO TABS: 1.0000 | ORAL_TABLET | Freq: Every day | ORAL | 0 refills | Status: DC

## 2022-10-25 MED ORDER — LEVOTHYROXINE SODIUM 25 MCG PO TABS: ORAL_TABLET | ORAL | 1 refills | Status: DC

## 2022-10-25 NOTE — Telephone Encounter (Signed)
Encourage patient to contact the pharmacy for refills or they can request refills through [facility]  WHAT PHARMACY WOULD THEY LIKE THIS SENT TO:  [id number] - [place], [place] - [address]  MEDICATION NAME & DOSE: atenolol-chlorthalidone (TENORETIC) 50-25 MG tablet rosuvastatin (CRESTOR) 20 MG tablet levothyroxine (SYNTHROID) 25 MCG tablet  NOTES/COMMENTS FROM PATIENT:      Front office please notify patient: It takes 48-72 hours to process rx refill requests Ask patient to call pharmacy to ensure rx is ready before heading there.

## 2022-10-25 NOTE — Telephone Encounter (Signed)
Sent prescriptions to the requested pharmacy, LM to inform the patient

## 2022-12-19 ENCOUNTER — Ambulatory Visit: Payer: Medicare HMO | Admitting: Family Medicine

## 2022-12-19 ENCOUNTER — Encounter: Payer: Self-pay | Admitting: Family Medicine

## 2022-12-19 VITALS — BP 130/74 | HR 52 | Temp 97.8°F | Ht 64.0 in | Wt 158.5 lb

## 2022-12-19 DIAGNOSIS — I10 Essential (primary) hypertension: Secondary | ICD-10-CM | POA: Diagnosis not present

## 2022-12-19 DIAGNOSIS — M8580 Other specified disorders of bone density and structure, unspecified site: Secondary | ICD-10-CM | POA: Diagnosis not present

## 2022-12-19 DIAGNOSIS — Z0000 Encounter for general adult medical examination without abnormal findings: Secondary | ICD-10-CM | POA: Diagnosis not present

## 2022-12-19 LAB — LIPID PANEL
Cholesterol: 154 mg/dL (ref 0–200)
HDL: 56.1 mg/dL (ref >39.00)
LDL Cholesterol: 70 mg/dL (ref 0–99)
NonHDL: 98.2
Total CHOL/HDL Ratio: 3
Triglycerides: 142 mg/dL (ref 0.0–149.0)
VLDL: 28.4 mg/dL (ref 0.0–40.0)

## 2022-12-19 LAB — BASIC METABOLIC PANEL
BUN: 12 mg/dL (ref 6–23)
CO2: 31 meq/L (ref 19–32)
Calcium: 10.1 mg/dL (ref 8.4–10.5)
Chloride: 99 meq/L (ref 96–112)
Creatinine, Ser: 0.85 mg/dL (ref 0.40–1.20)
GFR: 68.51 mL/min (ref >60.00)
Glucose, Bld: 96 mg/dL (ref 70–99)
Potassium: 3.7 meq/L (ref 3.5–5.1)
Sodium: 140 meq/L (ref 135–145)

## 2022-12-19 LAB — VITAMIN D 25 HYDROXY (VIT D DEFICIENCY, FRACTURES): VITD: 37.47 ng/mL (ref 30.00–100.00)

## 2022-12-19 LAB — CBC WITH DIFFERENTIAL/PLATELET
Basophils Absolute: 0 10*3/uL (ref 0.0–0.1)
Basophils Relative: 0.2 % (ref 0.0–3.0)
Eosinophils Absolute: 0.1 10*3/uL (ref 0.0–0.7)
Eosinophils Relative: 0.9 % (ref 0.0–5.0)
HCT: 44.6 % (ref 36.0–46.0)
Hemoglobin: 15.2 g/dL — ABNORMAL HIGH (ref 12.0–15.0)
Lymphocytes Relative: 25.2 % (ref 12.0–46.0)
Lymphs Abs: 1.8 10*3/uL (ref 0.7–4.0)
MCHC: 34 g/dL (ref 30.0–36.0)
MCV: 96 fL (ref 78.0–100.0)
Monocytes Absolute: 0.8 10*3/uL (ref 0.1–1.0)
Monocytes Relative: 11.5 % (ref 3.0–12.0)
Neutro Abs: 4.5 10*3/uL (ref 1.4–7.7)
Neutrophils Relative %: 62.2 % (ref 43.0–77.0)
Platelets: 225 10*3/uL (ref 150.0–400.0)
RBC: 4.65 Mil/uL (ref 3.87–5.11)
RDW: 14.1 % (ref 11.5–15.5)
WBC: 7.3 10*3/uL (ref 4.0–10.5)

## 2022-12-19 LAB — HEPATIC FUNCTION PANEL
ALT: 31 U/L (ref 0–35)
AST: 36 U/L (ref 0–37)
Albumin: 4.5 g/dL (ref 3.5–5.2)
Alkaline Phosphatase: 68 U/L (ref 39–117)
Bilirubin, Direct: 0.2 mg/dL (ref 0.0–0.3)
Total Bilirubin: 1.2 mg/dL (ref 0.2–1.2)
Total Protein: 7.7 g/dL (ref 6.0–8.3)

## 2022-12-19 LAB — TSH: TSH: 4 u[IU]/mL (ref 0.35–5.50)

## 2022-12-19 NOTE — Patient Instructions (Signed)
Follow up in 6 months to recheck BP and cholesterol We'll notify you of your lab results and make any changes if needed Keep up the good work on healthy diet and regular exercise- you're doing great! Call with any questions or concerns Stay Safe!  Stay Healthy! Happy Holidays!!!

## 2022-12-19 NOTE — Assessment & Plan Note (Signed)
Pt's PE WNL and unchanged from previous.  UTD on cologuard, mammo, Tdap, PNA.  Check labs.  Anticipatory guidance provided.

## 2022-12-19 NOTE — Assessment & Plan Note (Signed)
Check Vit D and replete prn. 

## 2022-12-19 NOTE — Progress Notes (Signed)
   Subjective:    Patient ID: Mary Cohen, female    DOB: Dec 22, 1950, 72 y.o.   MRN: [id number]  HPI CPE- UTD on cologuard, mammo, Tdap, PNA.  Pt reports feeling well  Patient Care Team    Relationship Specialty Notifications Start End  Sheliah Hatch, MD PCP - General Family Medicine  08/26/12   Luvenia Redden, MD Consulting Physician Gastroenterology  03/01/15     Health Maintenance  Topic Date Due   Zoster Vaccines- Shingrix (1 of 2) Never done   INFLUENZA VACCINE  09/06/2022   COVID-19 Vaccine (4 - 2023-24 season) 10/07/2022   Colonoscopy  12/19/2022   DTaP/Tdap/Td (3 - Tdap) 02/06/2023   MAMMOGRAM  07/18/2023   Medicare Annual Wellness (AWV)  10/04/2023   Fecal DNA (Cologuard)  06/19/2025   Pneumonia Vaccine 55+ Years old  Completed   DEXA SCAN  Completed   Hepatitis C Screening  Completed   HPV VACCINES  Aged Out      Review of Systems Patient reports no vision/ hearing changes, adenopathy,fever, weight change,  persistant/recurrent hoarseness , swallowing issues, chest pain, palpitations, edema, persistant/recurrent cough, hemoptysis, dyspnea (rest/exertional/paroxysmal nocturnal), gastrointestinal bleeding (melena, rectal bleeding), abdominal pain, significant heartburn, bowel changes, GU symptoms (dysuria, hematuria, incontinence), Gyn symptoms (abnormal  bleeding, pain),  syncope, focal weakness, memory loss, numbness & tingling, skin/hair/nail changes, abnormal bruising or bleeding, anxiety, or depression.     Objective:   Physical Exam General Appearance:    Alert, cooperative, no distress, appears stated age  Head:    Normocephalic, without obvious abnormality, atraumatic  Eyes:    PERRL, conjunctiva/corneas clear, EOM's intact both eyes  Ears:    Normal TM's and external ear canals, both ears  Nose:   Nares normal, septum midline, mucosa normal, no drainage    or sinus tenderness  Throat:   Lips, mucosa, and tongue normal; teeth and gums normal   Neck:   Supple, symmetrical, trachea midline, no adenopathy;    Thyroid: no enlargement/tenderness/nodules  Back:     Symmetric, no curvature, ROM normal, no CVA tenderness  Lungs:     Clear to auscultation bilaterally, respirations unlabored  Chest Wall:    No tenderness or deformity   Heart:    Regular rate and rhythm, S1 and S2 normal, no murmur, rub   or gallop  Breast Exam:    Deferred to mammo  Abdomen:     Soft, non-tender, bowel sounds active all four quadrants,    no masses, no organomegaly  Genitalia:    Deferred to GYN  Rectal:    Extremities:   Extremities normal, atraumatic, no cyanosis or edema  Pulses:   2+ and symmetric all extremities  Skin:   Skin color, texture, turgor normal, no rashes or lesions  Lymph nodes:   Cervical, supraclavicular, and axillary nodes normal  Neurologic:   CNII-XII intact, normal strength, sensation and reflexes    throughout          Assessment & Plan:

## 2022-12-19 NOTE — Assessment & Plan Note (Signed)
Chronic problem.  Adequate control today.  Currently asymptomatic.  Check labs due to Chlorthalidone use.

## 2022-12-20 ENCOUNTER — Telehealth: Payer: Self-pay

## 2022-12-20 NOTE — Telephone Encounter (Signed)
-----   Message from Neena Rhymes sent at 12/20/2022  7:28 AM EST ----- Labs are stable and look good!  No changes at this time

## 2022-12-20 NOTE — Telephone Encounter (Signed)
Patient viewed labs via my chart and she currently does not have any questions

## 2023-02-01 NOTE — Telephone Encounter (Signed)
error 

## 2023-02-26 ENCOUNTER — Telehealth: Payer: Self-pay | Admitting: Family Medicine

## 2023-02-26 NOTE — Telephone Encounter (Signed)
Type of form received:[facility]   Additional comments: [facility]- Levothyroxine Recall Info   Received ZO:XWRUEAV- Front Desk   Form should be Faxed/mailed to: N/A  Is patient requesting call for pickup:N/A  Form placed: [employer] charge sheet.  Provider will determine charge. N/A  Individual made aware of 3-5 business day turn around No?

## 2023-02-26 NOTE — Telephone Encounter (Signed)
Form retrieved. Does not need a phone note as it does not require a signature

## 2023-03-23 ENCOUNTER — Other Ambulatory Visit: Payer: Self-pay | Admitting: Family Medicine

## 2023-04-18 ENCOUNTER — Other Ambulatory Visit: Payer: Self-pay | Admitting: Family Medicine

## 2023-04-18 DIAGNOSIS — E785 Hyperlipidemia, unspecified: Secondary | ICD-10-CM

## 2023-06-11 ENCOUNTER — Encounter: Payer: Self-pay | Admitting: Family Medicine

## 2023-06-11 ENCOUNTER — Ambulatory Visit: Payer: Medicare HMO | Admitting: Family Medicine

## 2023-06-11 VITALS — BP 108/64 | HR 49 | Temp 98.4°F | Ht 64.0 in | Wt 164.5 lb

## 2023-06-11 DIAGNOSIS — E039 Hypothyroidism, unspecified: Secondary | ICD-10-CM

## 2023-06-11 DIAGNOSIS — I10 Essential (primary) hypertension: Secondary | ICD-10-CM | POA: Diagnosis not present

## 2023-06-11 DIAGNOSIS — E785 Hyperlipidemia, unspecified: Secondary | ICD-10-CM | POA: Diagnosis not present

## 2023-06-11 LAB — HEPATIC FUNCTION PANEL
ALT: 30 U/L (ref 0–35)
AST: 31 U/L (ref 0–37)
Albumin: 4.3 g/dL (ref 3.5–5.2)
Alkaline Phosphatase: 63 U/L (ref 39–117)
Bilirubin, Direct: 0.1 mg/dL (ref 0.0–0.3)
Total Bilirubin: 1 mg/dL (ref 0.2–1.2)
Total Protein: 7.4 g/dL (ref 6.0–8.3)

## 2023-06-11 LAB — CBC WITH DIFFERENTIAL/PLATELET
Basophils Absolute: 0 10*3/uL (ref 0.0–0.1)
Basophils Relative: 0.3 % (ref 0.0–3.0)
Eosinophils Absolute: 0.1 10*3/uL (ref 0.0–0.7)
Eosinophils Relative: 0.8 % (ref 0.0–5.0)
HCT: 44.5 % (ref 36.0–46.0)
Hemoglobin: 15.2 g/dL — ABNORMAL HIGH (ref 12.0–15.0)
Lymphocytes Relative: 31.6 % (ref 12.0–46.0)
Lymphs Abs: 2 10*3/uL (ref 0.7–4.0)
MCHC: 34.2 g/dL (ref 30.0–36.0)
MCV: 95.8 fl (ref 78.0–100.0)
Monocytes Absolute: 0.7 10*3/uL (ref 0.1–1.0)
Monocytes Relative: 10.6 % (ref 3.0–12.0)
Neutro Abs: 3.6 10*3/uL (ref 1.4–7.7)
Neutrophils Relative %: 56.7 % (ref 43.0–77.0)
Platelets: 222 10*3/uL (ref 150.0–400.0)
RBC: 4.65 Mil/uL (ref 3.87–5.11)
RDW: 13.8 % (ref 11.5–15.5)
WBC: 6.3 10*3/uL (ref 4.0–10.5)

## 2023-06-11 LAB — LIPID PANEL
Cholesterol: 157 mg/dL (ref 0–200)
HDL: 51 mg/dL (ref >39.00)
LDL Cholesterol: 67 mg/dL (ref 0–99)
NonHDL: 106.19
Total CHOL/HDL Ratio: 3
Triglycerides: 195 mg/dL — ABNORMAL HIGH (ref 0.0–149.0)
VLDL: 39 mg/dL (ref 0.0–40.0)

## 2023-06-11 LAB — BASIC METABOLIC PANEL WITH GFR
BUN: 12 mg/dL (ref 6–23)
CO2: 32 meq/L (ref 19–32)
Calcium: 9.8 mg/dL (ref 8.4–10.5)
Chloride: 100 meq/L (ref 96–112)
Creatinine, Ser: 0.78 mg/dL (ref 0.40–1.20)
GFR: 75.7 mL/min (ref >60.00)
Glucose, Bld: 105 mg/dL — ABNORMAL HIGH (ref 70–99)
Potassium: 3.9 meq/L (ref 3.5–5.1)
Sodium: 141 meq/L (ref 135–145)

## 2023-06-11 LAB — TSH: TSH: 4.04 u[IU]/mL (ref 0.35–5.50)

## 2023-06-11 MED ORDER — ATENOLOL-CHLORTHALIDONE 50-25 MG PO TABS: 1.0000 | ORAL_TABLET | Freq: Every day | ORAL | 1 refills | Status: DC

## 2023-06-11 MED ORDER — LEVOTHYROXINE SODIUM 25 MCG PO TABS: ORAL_TABLET | ORAL | 1 refills | Status: DC

## 2023-06-11 NOTE — Patient Instructions (Signed)
Schedule your complete physical in 6 months ?We'll notify you of your lab results and make any changes if needed ?Keep up the good work on healthy diet and regular exercise- you're doing great! ?Call with any questions or concerns ?Stay Safe!  Stay Healthy! ?Happy Mother's Day!!! ?

## 2023-06-11 NOTE — Assessment & Plan Note (Signed)
 Chronic problem.  On Crestor 20mg  daily w/o difficulty.  Check labs.  Adjust meds prn

## 2023-06-11 NOTE — Assessment & Plan Note (Signed)
 Chronic problem.  Excellent control on Atenolol  Chlorthalidone  50/25mg .  Currently asymptomatic.  Check labs due to Chlorthalidone  use but no anticipated med changes.  Will follow.

## 2023-06-11 NOTE — Progress Notes (Signed)
   Subjective:    Patient ID: Mary Cohen, female    DOB: [birth year], 73 y.o.   MRN: [id number]  HPI HTN- chronic problem, on Atenolol  Chlorthalidone  50/25mg  w/ excellent control.  No CP, SOB, HA's, visual changes, edema.  Hyperlipidemia- chronic problem, on Crestor  20mg  daily.  No abd pain, N/V.  [facility]  Hypothyroid- chronic problem, on Levothyroxine  25mcg.  No changes to skin/hair/nails.  Energy level is stable.   Review of Systems For ROS see HPI     Objective:   Physical Exam Vitals reviewed.  Constitutional:      General: She is not in acute distress.    Appearance: Normal appearance. She is well-developed. She is not ill-appearing.  HENT:     Head: Normocephalic and atraumatic.  Eyes:     Conjunctiva/sclera: Conjunctivae normal.     Pupils: Pupils are equal, round, and reactive to light.  Neck:     Thyroid : No thyromegaly.  Cardiovascular:     Rate and Rhythm: Normal rate and regular rhythm.     Pulses: Normal pulses.     Heart sounds: Normal heart sounds. No murmur heard. Pulmonary:     Effort: Pulmonary effort is normal. No respiratory distress.     Breath sounds: Normal breath sounds.  Abdominal:     General: There is no distension.     Palpations: Abdomen is soft.     Tenderness: There is no abdominal tenderness.  Musculoskeletal:     Cervical back: Normal range of motion and neck supple.     Right lower leg: No edema.     Left lower leg: No edema.  Lymphadenopathy:     Cervical: No cervical adenopathy.  Skin:    General: Skin is warm and dry.  Neurological:     General: No focal deficit present.     Mental Status: She is alert and oriented to person, place, and time.  Psychiatric:        Mood and Affect: Mood normal.        Behavior: Behavior normal.        Thought Content: Thought content normal.           Assessment & Plan:

## 2023-06-11 NOTE — Assessment & Plan Note (Signed)
 Chronic problem.  Currently on Levothyroxine  25mcg daily.  Asymptomatic.  Check labs.  Adjust meds prn

## 2023-06-12 ENCOUNTER — Encounter: Payer: Self-pay | Admitting: Family Medicine

## 2023-06-12 ENCOUNTER — Telehealth: Payer: Self-pay

## 2023-06-12 NOTE — Telephone Encounter (Signed)
-----   Message from Laymon Priest sent at 06/12/2023  7:16 AM EDT ----- Labs look great!  No changes at this time

## 2023-06-12 NOTE — Telephone Encounter (Signed)
 Pt has been notified.

## 2023-07-23 ENCOUNTER — Other Ambulatory Visit: Payer: Self-pay | Admitting: Family Medicine

## 2023-07-23 DIAGNOSIS — Z1231 Encounter for screening mammogram for malignant neoplasm of breast: Secondary | ICD-10-CM

## 2023-08-01 ENCOUNTER — Ambulatory Visit
Admission: RE | Admit: 2023-08-01 | Discharge: 2023-08-01 | Disposition: A | Discharge status: Home / Self Care | Source: Ambulatory Visit | Attending: Family Medicine | Admitting: Family Medicine

## 2023-08-01 DIAGNOSIS — Z1231 Encounter for screening mammogram for malignant neoplasm of breast: Secondary | ICD-10-CM

## 2023-10-09 ENCOUNTER — Other Ambulatory Visit: Payer: Self-pay | Admitting: Family Medicine

## 2023-10-09 ENCOUNTER — Ambulatory Visit: Payer: Medicare HMO | Admitting: *Deleted

## 2023-10-09 VITALS — Ht 64.0 in | Wt 164.0 lb

## 2023-10-09 DIAGNOSIS — Z0000 Encounter for general adult medical examination without abnormal findings: Secondary | ICD-10-CM

## 2023-10-09 DIAGNOSIS — E785 Hyperlipidemia, unspecified: Secondary | ICD-10-CM

## 2023-10-09 NOTE — Progress Notes (Signed)
 Subjective:   Mary Cohen is a 73 y.o. female who presents for Medicare Annual (Subsequent) preventive examination.  Visit Complete: Virtual I connected with  [name] on 10/09/23 by a audio enabled telemedicine application and verified that I am speaking with the correct person using two identifiers.  Patient Location: Home  Provider Location: Home Office  I discussed the limitations of evaluation and management by telemedicine. The patient expressed understanding and agreed to proceed.  Vital Signs: Because this visit was a virtual/telehealth visit, some criteria may be missing or patient reported. Any vitals not documented were not able to be obtained and vitals that have been documented are patient reported.    Cardiac Risk Factors include: advanced age (>23men, >26 women);hypertension;obesity (BMI >30kg/m2);family history of premature cardiovascular disease     Objective:    Today's Vitals   10/09/23 1020  Weight: 164 lb (74.4 kg)  Height: 5' 4 (1.626 m)   Body mass index is 28.15 kg/m.     10/09/2023   10:55 AM 10/04/2022    9:37 AM 09/21/2021   11:33 AM 04/18/2020    2:58 PM 03/25/2019   10:49 AM 04/18/2017    9:31 AM 04/12/2016   10:38 AM  Advanced Directives  Does Patient Have a Medical Advance Directive? Yes Yes No No No No  No   Type of Sales promotion account executive of Attorney       Copy of Healthcare Power of Attorney in Chart? Yes - validated most recent copy scanned in chart (See row information) Yes - validated most recent copy scanned in chart (See row information)       Would patient like information on creating a medical advance directive?   No - Patient declined No - Patient declined Yes (MAU/Ambulatory/Procedural Areas - Information given) Yes (MAU/Ambulatory/Procedural Areas - Information given)  Yes (MAU/Ambulatory/Procedural Areas - Information given)      Data saved with a previous flowsheet  row definition    Current Medications (verified) Outpatient Encounter Medications as of 10/09/2023  Medication Sig   atenolol -chlorthalidone  (TENORETIC ) 50-25 MG tablet Take 1 tablet by mouth daily.   Cholecalciferol 250 MCG (10000 UT) CAPS Take by mouth.   levothyroxine  (SYNTHROID ) 25 MCG tablet TAKE 1 TABLET BY MOUTH EVERY DAY BEFORE BREAKFAST   Multiple Vitamin (MULTIVITAMIN) tablet Take 1 tablet by mouth daily.   Multiple Vitamins-Minerals (MEGAVITE FRUITS & VEGGIES) TABS Take by mouth daily.   rosuvastatin  (CRESTOR ) 20 MG tablet TAKE 1 TABLET BY MOUTH EVERY DAY AT NIGHT   vitamin C (ASCORBIC ACID) 500 MG tablet Take 500 mg by mouth daily.   No facility-administered encounter medications on file as of 10/09/2023.    Allergies (verified) Codeine   History: Past Medical History:  Diagnosis Date   Fibroid 1995   Hypertension 4/96   Ovarian mass 04/1998   right ovarian mass   Thyroid  disease 03/1993   hypothyroidism   Tremors of nervous system    Past Surgical History:  Procedure Laterality Date   ABDOMINAL HYSTERECTOMY  07/1996   BILATERAL SALPINGOOPHORECTOMY  05/1998   parasitic fibroid   COLONOSCOPY  11/2006   negative, recheck in 10 years   TONSILLECTOMY AND ADENOIDECTOMY  3rd grade   TUBAL LIGATION Bilateral 1985   VAGINAL DELIVERY  07/1996   secondary to fibroids and adenomyosis, ovaries remain   Family History  Problem Relation Age of Onset   Hypertension Mother    Diabetes Mother  Hyperlipidemia Mother    Atrial fibrillation Mother    Hypertension Father    Stroke Father    Heart disease Father        A fib, CABG in past   Thyroid  disease Sister    Social History   Socioeconomic History   Marital status: Married    Spouse name: Not on file   Number of children: Not on file   Years of education: Not on file   Highest education level: Associate degree: occupational, Scientist, product/process development, or vocational program  Occupational History   Not on file  Tobacco Use    Smoking status: Never   Smokeless tobacco: Never  Vaping Use   Vaping status: Never Used  Substance and Sexual Activity   Alcohol use: Yes    Alcohol/week: 3.0 standard drinks of alcohol    Types: 3 Standard drinks or equivalent per week   Drug use: No   Sexual activity: Yes    Birth control/protection: Surgical    Comment: hysterectomy  Other Topics Concern   Not on file  Social History Narrative   Not on file   Social Drivers of Health   Financial Resource Strain: Low Risk  (10/09/2023)   Overall Financial Resource Strain (CARDIA)    Difficulty of Paying Living Expenses: Not hard at all  Food Insecurity: No Food Insecurity (10/09/2023)   Hunger Vital Sign    Worried About Running Out of Food in the Last Year: Never true    Ran Out of Food in the Last Year: Never true  Transportation Needs: No Transportation Needs (10/09/2023)   PRAPARE - Administrator, Civil Service (Medical): No    Lack of Transportation (Non-Medical): No  Physical Activity: Sufficiently Active (10/09/2023)   Exercise Vital Sign    Days of Exercise per Week: 5 days    Minutes of Exercise per Session: 50 min  Stress: No Stress Concern Present (10/09/2023)   [employer] of Occupational Health - Occupational Stress Questionnaire    Feeling of Stress: Not at all  Social Connections: Moderately Integrated (10/09/2023)   Social Connection and Isolation Panel    Frequency of Communication with Friends and Family: More than three times a week    Frequency of Social Gatherings with Friends and Family: Once a week    Attends Religious Services: More than 4 times per year    Active Member of [employer] or Organizations: No    Attends Engineer, structural: Never    Marital Status: Married    Tobacco Counseling Counseling given: Not Answered   Clinical Intake:  Pre-visit preparation completed: Yes  Pain : No/denies pain     Diabetes: No  How often do you need to have someone help you when  you read instructions, pamphlets, or other written materials from your doctor or pharmacy?: 1 - Never  Interpreter Needed?: No  Information entered by :: Mliss Graff LPN   Activities of Daily Living    10/09/2023   10:36 AM  In your present state of health, do you have any difficulty performing the following activities:  Hearing? 1  Vision? 0  Difficulty concentrating or making decisions? 0  Walking or climbing stairs? 0  Dressing or bathing? 0  Doing errands, shopping? 0  Preparing Food and eating ? N  Using the Toilet? N  In the past six months, have you accidently leaked urine? N  Do you have problems with loss of bowel control? N  Managing your Medications? N  Managing your Finances? N  Housekeeping or managing your Housekeeping? N    Patient Care Team: Mahlon Comer BRAVO, MD as PCP - General (Family Medicine) Claudene Oneil BIRCH, MD as Consulting Physician (Gastroenterology)  Indicate any recent Medical Services you may have received from other than [facility] providers in the past year (date may be approximate).     Assessment:   This is a routine wellness examination for Nunda.  Hearing/Vision screen Hearing Screening - Comments:: Bilateral hearing aids Vision Screening - Comments:: Not up to date   Goals Addressed             This Visit's Progress    Increase physical activity   On track    Patient Stated   On track    Increase activity, drink more water & eat healthier     Patient Stated       Maintain current lifestyle       Depression Screen    10/09/2023   10:27 AM 06/11/2023    8:54 AM 12/19/2022    8:40 AM 10/04/2022    9:43 AM 06/07/2022    7:56 AM 12/07/2021    7:55 AM 12/07/2021    7:52 AM  PHQ 2/9 Scores  PHQ - 2 Score 0 0 0 0 0 0 0  PHQ- 9 Score 0 0 0 0 0 0     Fall Risk    10/09/2023   10:57 AM 06/11/2023    8:54 AM 12/19/2022    8:40 AM 10/04/2022   10:00 AM 06/07/2022    7:56 AM  Fall Risk   Falls in the past year? 0 0 0 0 0  Number falls  in past yr: 0 0 0 0 0  Injury with Fall? 0 0 0 0 0  Risk for fall due to :  No Fall Risks No Fall Risks  No Fall Risks  Follow up Falls evaluation completed;Education provided;Falls prevention discussed   Falls evaluation completed;Education provided;Falls prevention discussed Falls evaluation completed    MEDICARE RISK AT HOME: Medicare Risk at Home Any stairs in or around the home?: Yes If so, are there any without handrails?: No Home free of loose throw rugs in walkways, pet beds, electrical cords, etc?: Yes Adequate lighting in your home to reduce risk of falls?: Yes Life alert?: No Use of a cane, walker or w/c?: No Grab bars in the bathroom?: No Shower chair or bench in shower?: No Elevated toilet seat or a handicapped toilet?: Yes  TIMED UP AND GO:  Was the test performed?  No    Cognitive Function:        10/09/2023   10:36 AM 10/04/2022    9:40 AM 09/21/2021   11:36 AM  6CIT Screen  What Year? 0 points 0 points 0 points  What month? 0 points 0 points 0 points  What time? 0 points 0 points 0 points  Count back from 20 0 points 0 points 0 points  Months in reverse 0 points 0 points 0 points  Repeat phrase 0 points 6 points 0 points  Total Score 0 points 6 points 0 points    Immunizations Immunization History  Administered Date(s) Administered   DTaP 08/13/2011   Fluad Quad(high Dose 65+) 01/18/2020, 11/23/2020, 12/07/2021   INFLUENZA, HIGH DOSE SEASONAL PF 11/01/2017, 12/09/2018   Influenza,inj,Quad PF,6+ Mos 10/18/2015, 10/15/2016   Influenza-Unspecified 11/06/2014   Moderna Sars-Covid-2 Vaccination 03/09/2019, 04/07/2019, 12/08/2019   Pneumococcal Conjugate-13 04/12/2016   Pneumococcal Polysaccharide-23 04/18/2017  Td 02/05/2013    TDAP status: Due, Education has been provided regarding the importance of this vaccine. Advised may receive this vaccine at local pharmacy or Health Dept. Aware to provide a copy of the vaccination record if obtained from local  pharmacy or Health Dept. Verbalized acceptance and understanding.  Flu Vaccine status: Due, Education has been provided regarding the importance of this vaccine. Advised may receive this vaccine at local pharmacy or Health Dept. Aware to provide a copy of the vaccination record if obtained from local pharmacy or Health Dept. Verbalized acceptance and understanding.  Pneumococcal vaccine status: Up to date  Covid-19 vaccine status: Information provided on how to obtain vaccines.   Qualifies for Shingles Vaccine? Yes   Zostavax completed No   Shingrix Completed?: No.    Education has been provided regarding the importance of this vaccine. Patient has been advised to call insurance company to determine out of pocket expense if they have not yet received this vaccine. Advised may also receive vaccine at local pharmacy or Health Dept. Verbalized acceptance and understanding.  Screening Tests Health Maintenance  Topic Date Due   Zoster Vaccines- Shingrix (1 of 2) Never done   Colonoscopy  12/19/2022   DTaP/Tdap/Td (3 - Tdap) 02/06/2023   INFLUENZA VACCINE  09/06/2023   MAMMOGRAM  07/31/2024   Medicare Annual Wellness (AWV)  10/08/2024   Fecal DNA (Cologuard)  06/19/2025   Pneumococcal Vaccine: 50+ Years  Completed   DEXA SCAN  Completed   Hepatitis C Screening  Completed   HPV VACCINES  Aged Out   Meningococcal B Vaccine  Aged Out   COVID-19 Vaccine  Discontinued    Health Maintenance  Health Maintenance Due  Topic Date Due   Zoster Vaccines- Shingrix (1 of 2) Never done   Colonoscopy  12/19/2022   DTaP/Tdap/Td (3 - Tdap) 02/06/2023   INFLUENZA VACCINE  09/06/2023    Colorectal cancer screening: Type of screening: Cologuard. Completed 2024. Repeat every 3 years  Mammogram status: Completed  . Repeat every year  Bone Density status: Completed 2022. Results reflect: Bone density results: NORMAL. Repeat every 5 years.  Lung Cancer Screening: (Low Dose CT Chest recommended if Age  44-80 years, 20 pack-year currently smoking OR have quit w/in 15years.) does not qualify.   Lung Cancer Screening Referral:   Additional Screening:  Hepatitis C Screening: does not qualify; Completed 2018  Vision Screening: Recommended annual ophthalmology exams for early detection of glaucoma and other disorders of the eye. Is the patient up to date with their annual eye exam?  No  Who is the provider or what is the name of the office in which the patient attends annual eye exams? Education provided If pt is not established with a provider, would they like to be referred to a provider to establish care? No .   Dental Screening: Recommended annual dental exams for proper oral hygiene    Community Resource Referral / Chronic Care Management: CRR required this visit?  No   CCM required this visit?  No     Plan:     I have personally reviewed and noted the following in the patient's chart:   Medical and social history Use of alcohol, tobacco or illicit drugs  Current medications and supplements including opioid prescriptions. Patient is not currently taking opioid prescriptions. Functional ability and status Nutritional status Physical activity Advanced directives List of other physicians Hospitalizations, surgeries, and ER visits in previous 12 months Vitals Screenings to include cognitive, depression, and  falls Referrals and appointments  In addition, I have reviewed and discussed with patient certain preventive protocols, quality metrics, and best practice recommendations. A written personalized care plan for preventive services as well as general preventive health recommendations were provided to patient.     Mliss Graff, LPN   [id number]   After Visit Summary: (MyChart) Due to this being a telephonic visit, the after visit summary with patients personalized plan was offered to patient via MyChart   Nurse Notes:

## 2023-10-09 NOTE — Patient Instructions (Signed)
 Mary Cohen , Thank you for taking time to come for your Medicare Wellness Visit. I appreciate your ongoing commitment to your health goals. Please review the following plan we discussed and let me know if I can assist you in the future.   Screening recommendations/referrals: Colonoscopy: up to date Mammogram: Education provided Bone Density: up to date Recommended yearly ophthalmology/optometry visit for glaucoma screening and checkup Recommended yearly dental visit for hygiene and checkup  Vaccinations: Influenza vaccine: Education provided Pneumococcal vaccine: up to date Tdap vaccine: Education provided Shingles vaccine: Education provided      Preventive Care 65 Years and Older, Female Preventive care refers to lifestyle choices and visits with your health care provider that can promote health and wellness. What does preventive care include? A yearly physical exam. This is also called an annual well check. Dental exams once or twice a year. Routine eye exams. Ask your health care provider how often you should have your eyes checked. Personal lifestyle choices, including: Daily care of your teeth and gums. Regular physical activity. Eating a healthy diet. Avoiding tobacco and drug use. Limiting alcohol use. Practicing safe sex. Taking low-dose aspirin every day. Taking vitamin and mineral supplements as recommended by your health care provider. What happens during an annual well check? The services and screenings done by your health care provider during your annual well check will depend on your age, overall health, lifestyle risk factors, and family history of disease. Counseling  Your health care provider may ask you questions about your: Alcohol use. Tobacco use. Drug use. Emotional well-being. Home and relationship well-being. Sexual activity. Eating habits. History of falls. Memory and ability to understand (cognition). Work and work Astronomer. Reproductive  health. Screening  You may have the following tests or measurements: Height, weight, and BMI. Blood pressure. Lipid and cholesterol levels. These may be checked every 5 years, or more frequently if you are over 73 years old. Skin check. Lung cancer screening. You may have this screening every year starting at age 73 if you have a 30-pack-year history of smoking and currently smoke or have quit within the past 15 years. Fecal occult blood test (FOBT) of the stool. You may have this test every year starting at age 73. Flexible sigmoidoscopy or colonoscopy. You may have a sigmoidoscopy every 5 years or a colonoscopy every 10 years starting at age 73. Hepatitis C blood test. Hepatitis B blood test. Sexually transmitted disease (STD) testing. Diabetes screening. This is done by checking your blood sugar (glucose) after you have not eaten for a while (fasting). You may have this done every 1-3 years. Bone density scan. This is done to screen for osteoporosis. You may have this done starting at age 73. Mammogram. This may be done every 1-2 years. Talk to your health care provider about how often you should have regular mammograms. Talk with your health care provider about your test results, treatment options, and if necessary, the need for more tests. Vaccines  Your health care provider may recommend certain vaccines, such as: Influenza vaccine. This is recommended every year. Tetanus, diphtheria, and acellular pertussis (Tdap, Td) vaccine. You may need a Td booster every 10 years. Zoster vaccine. You may need this after age 73. Pneumococcal 13-valent conjugate (PCV13) vaccine. One dose is recommended after age 73. Pneumococcal polysaccharide (PPSV23) vaccine. One dose is recommended after age 73. Talk to your health care provider about which screenings and vaccines you need and how often you need them. This information is not intended  to replace advice given to you by your health care provider.  Make sure you discuss any questions you have with your health care provider. Document Released: 02/18/2015 Document Revised: 10/12/2015 Document Reviewed: 11/23/2014 Elsevier Interactive Patient Education  2017 [employer].  Fall Prevention in the Home Falls can cause injuries. They can happen to people of all ages. There are many things you can do to make your home safe and to help prevent falls. What can I do on the outside of my home? Regularly fix the edges of walkways and driveways and fix any cracks. Remove anything that might make you trip as you walk through a door, such as a raised step or threshold. Trim any bushes or trees on the path to your home. Use bright outdoor lighting. Clear any walking paths of anything that might make someone trip, such as rocks or tools. Regularly check to see if handrails are loose or broken. Make sure that both sides of any steps have handrails. Any raised decks and porches should have guardrails on the edges. Have any leaves, snow, or ice cleared regularly. Use sand or salt on walking paths during winter. Clean up any spills in your garage right away. This includes oil or grease spills. What can I do in the bathroom? Use night lights. Install grab bars by the toilet and in the tub and shower. Do not use towel bars as grab bars. Use non-skid mats or decals in the tub or shower. If you need to sit down in the shower, use a plastic, non-slip stool. Keep the floor dry. Clean up any water that spills on the floor as soon as it happens. Remove soap buildup in the tub or shower regularly. Attach bath mats securely with double-sided non-slip rug tape. Do not have throw rugs and other things on the floor that can make you trip. What can I do in the bedroom? Use night lights. Make sure that you have a light by your bed that is easy to reach. Do not use any sheets or blankets that are too big for your bed. They should not hang down onto the floor. Have a  firm chair that has side arms. You can use this for support while you get dressed. Do not have throw rugs and other things on the floor that can make you trip. What can I do in the kitchen? Clean up any spills right away. Avoid walking on wet floors. Keep items that you use a lot in easy-to-reach places. If you need to reach something above you, use a strong step stool that has a grab bar. Keep electrical cords out of the way. Do not use floor polish or wax that makes floors slippery. If you must use wax, use non-skid floor wax. Do not have throw rugs and other things on the floor that can make you trip. What can I do with my stairs? Do not leave any items on the stairs. Make sure that there are handrails on both sides of the stairs and use them. Fix handrails that are broken or loose. Make sure that handrails are as long as the stairways. Check any carpeting to make sure that it is firmly attached to the stairs. Fix any carpet that is loose or worn. Avoid having throw rugs at the top or bottom of the stairs. If you do have throw rugs, attach them to the floor with carpet tape. Make sure that you have a light switch at the top of the stairs and  the bottom of the stairs. If you do not have them, ask someone to add them for you. What else can I do to help prevent falls? Wear shoes that: Do not have high heels. Have rubber bottoms. Are comfortable and fit you well. Are closed at the toe. Do not wear sandals. If you use a stepladder: Make sure that it is fully opened. Do not climb a closed stepladder. Make sure that both sides of the stepladder are locked into place. Ask someone to hold it for you, if possible. Clearly mark and make sure that you can see: Any grab bars or handrails. First and last steps. Where the edge of each step is. Use tools that help you move around (mobility aids) if they are needed. These include: Canes. Walkers. Scooters. Crutches. Turn on the lights when you  go into a dark area. Replace any light bulbs as soon as they burn out. Set up your furniture so you have a clear path. Avoid moving your furniture around. If any of your floors are uneven, fix them. If there are any pets around you, be aware of where they are. Review your medicines with your doctor. Some medicines can make you feel dizzy. This can increase your chance of falling. Ask your doctor what other things that you can do to help prevent falls. This information is not intended to replace advice given to you by your health care provider. Make sure you discuss any questions you have with your health care provider. Document Released: 11/18/2008 Document Revised: 06/30/2015 Document Reviewed: 02/26/2014 Elsevier Interactive Patient Education  2017 [employer].

## 2023-12-08 ENCOUNTER — Other Ambulatory Visit: Payer: Self-pay | Admitting: Family Medicine

## 2023-12-27 ENCOUNTER — Encounter: Payer: Self-pay | Admitting: Family Medicine

## 2023-12-27 ENCOUNTER — Ambulatory Visit (INDEPENDENT_AMBULATORY_CARE_PROVIDER_SITE_OTHER): Admitting: Family Medicine

## 2023-12-27 VITALS — BP 132/76 | HR 52 | Temp 98.5°F | Resp 16 | Ht 64.0 in | Wt 165.4 lb

## 2023-12-27 DIAGNOSIS — Z23 Encounter for immunization: Secondary | ICD-10-CM

## 2023-12-27 DIAGNOSIS — Z0000 Encounter for general adult medical examination without abnormal findings: Secondary | ICD-10-CM

## 2023-12-27 DIAGNOSIS — I10 Essential (primary) hypertension: Secondary | ICD-10-CM

## 2023-12-27 DIAGNOSIS — M8580 Other specified disorders of bone density and structure, unspecified site: Secondary | ICD-10-CM

## 2023-12-27 LAB — LIPID PANEL
Cholesterol: 152 mg/dL (ref 0–200)
HDL: 55.3 mg/dL (ref >39.00)
LDL Cholesterol: 59 mg/dL (ref 0–99)
NonHDL: 96.59
Total CHOL/HDL Ratio: 3
Triglycerides: 189 mg/dL — ABNORMAL HIGH (ref 0.0–149.0)
VLDL: 37.8 mg/dL (ref 0.0–40.0)

## 2023-12-27 LAB — BASIC METABOLIC PANEL WITH GFR
BUN: 12 mg/dL (ref 6–23)
CO2: 35 meq/L — ABNORMAL HIGH (ref 19–32)
Calcium: 9.9 mg/dL (ref 8.4–10.5)
Chloride: 98 meq/L (ref 96–112)
Creatinine, Ser: 0.86 mg/dL (ref 0.40–1.20)
GFR: 67.07 mL/min (ref >60.00)
Glucose, Bld: 104 mg/dL — ABNORMAL HIGH (ref 70–99)
Potassium: 3.3 meq/L — ABNORMAL LOW (ref 3.5–5.1)
Sodium: 141 meq/L (ref 135–145)

## 2023-12-27 LAB — CBC WITH DIFFERENTIAL/PLATELET
Basophils Absolute: 0.3 K/uL — ABNORMAL HIGH (ref 0.0–0.1)
Basophils Relative: 4 % — ABNORMAL HIGH (ref 0.0–3.0)
Eosinophils Absolute: 0.1 K/uL (ref 0.0–0.7)
Eosinophils Relative: 0.8 % (ref 0.0–5.0)
HCT: 44.7 % (ref 36.0–46.0)
Hemoglobin: 15.2 g/dL — ABNORMAL HIGH (ref 12.0–15.0)
Lymphocytes Relative: 27.1 % (ref 12.0–46.0)
Lymphs Abs: 2.1 K/uL (ref 0.7–4.0)
MCHC: 33.9 g/dL (ref 30.0–36.0)
MCV: 94.4 fl (ref 78.0–100.0)
Monocytes Absolute: 0.7 K/uL (ref 0.1–1.0)
Monocytes Relative: 9.4 % (ref 3.0–12.0)
Neutro Abs: 4.6 K/uL (ref 1.4–7.7)
Neutrophils Relative %: 58.7 % (ref 43.0–77.0)
Platelets: 254 K/uL (ref 150.0–400.0)
RBC: 4.73 Mil/uL (ref 3.87–5.11)
RDW: 14.1 % (ref 11.5–15.5)
WBC: 7.9 K/uL (ref 4.0–10.5)

## 2023-12-27 LAB — HEPATIC FUNCTION PANEL
ALT: 24 U/L (ref 0–35)
AST: 28 U/L (ref 0–37)
Albumin: 4.4 g/dL (ref 3.5–5.2)
Alkaline Phosphatase: 65 U/L (ref 39–117)
Bilirubin, Direct: 0.2 mg/dL (ref 0.0–0.3)
Total Bilirubin: 0.9 mg/dL (ref 0.2–1.2)
Total Protein: 7.7 g/dL (ref 6.0–8.3)

## 2023-12-27 LAB — VITAMIN D 25 HYDROXY (VIT D DEFICIENCY, FRACTURES): VITD: 28.72 ng/mL — ABNORMAL LOW (ref 30.00–100.00)

## 2023-12-27 LAB — TSH: TSH: 4.53 u[IU]/mL (ref 0.35–5.50)

## 2023-12-27 NOTE — Assessment & Plan Note (Signed)
 Pt's PE WNL.  UTD on mammo, cologuard, PNA.  Flu shot given today.  Check labs. Anticipatory guidance provided.

## 2023-12-27 NOTE — Patient Instructions (Signed)
 Follow up in 6 months to recheck blood pressure and cholesterol We'll notify you of your lab results and make any changes if needed Keep up the good work!  You look great! Call with any questions or concerns Stay Safe!  Stay Healthy! Happy Holidays!

## 2023-12-27 NOTE — Progress Notes (Signed)
   Subjective:    Patient ID: Mary Cohen, female    DOB: [birth year], 73 y.o.   MRN: [id number]  HPI CPE- UTD on cologuard, mammo, PNA.  Will get flu today  Patient Care Team    Relationship Specialty Notifications Start End  Mahlon Comer BRAVO, MD PCP - General Family Medicine  08/26/12   Claudene Oneil BIRCH, MD Consulting Physician Gastroenterology  03/01/15     Health Maintenance  Topic Date Due   Zoster Vaccines- Shingrix (1 of 2) Never done   Colonoscopy  12/19/2022   DTaP/Tdap/Td (3 - Tdap) 02/06/2023   Mammogram  07/31/2024   Medicare Annual Wellness (AWV)  10/08/2024   Fecal DNA (Cologuard)  06/19/2025   Pneumococcal Vaccine: 50+ Years  Completed   Influenza Vaccine  Completed   Bone Density Scan  Completed   Hepatitis C Screening  Completed   Meningococcal B Vaccine  Aged Out   COVID-19 Vaccine  Discontinued      Review of Systems Patient reports no vision/ hearing changes, adenopathy,fever, weight change,  persistant/recurrent hoarseness , swallowing issues, chest pain, palpitations, edema, persistant/recurrent cough, hemoptysis, dyspnea (rest/exertional/paroxysmal nocturnal), gastrointestinal bleeding (melena, rectal bleeding), abdominal pain, significant heartburn, bowel changes, GU symptoms (dysuria, hematuria, incontinence), Gyn symptoms (abnormal  bleeding, pain),  syncope, focal weakness, memory loss, numbness & tingling, skin/hair/nail changes, abnormal bruising or bleeding, anxiety, or depression.     Objective:   Physical Exam General Appearance:    Alert, cooperative, no distress, appears stated age  Head:    Normocephalic, without obvious abnormality, atraumatic  Eyes:    PERRL, conjunctiva/corneas clear, EOM's intact both eyes  Ears:    Normal TM's and external ear canals, both ears  Nose:   Nares normal, septum midline, mucosa normal, no drainage    or sinus tenderness  Throat:   Lips, mucosa, and tongue normal; teeth and gums normal  Neck:    Supple, symmetrical, trachea midline, no adenopathy;    Thyroid : no enlargement/tenderness/nodules  Back:     Symmetric, no curvature, ROM normal, no CVA tenderness  Lungs:     Clear to auscultation bilaterally, respirations unlabored  Chest Wall:    No tenderness or deformity   Heart:    Regular rate and rhythm, S1 and S2 normal, no murmur, rub   or gallop  Breast Exam:    Deferred to mammo  Abdomen:     Soft, non-tender, bowel sounds active all four quadrants,    no masses, no organomegaly  Genitalia:    Deferred  Rectal:    Extremities:   Extremities normal, atraumatic, no cyanosis or edema  Pulses:   2+ and symmetric all extremities  Skin:   Skin color, texture, turgor normal, no rashes or lesions  Lymph nodes:   Cervical, supraclavicular, and axillary nodes normal  Neurologic:   CNII-XII intact, normal strength, sensation and reflexes    throughout          Assessment & Plan:

## 2023-12-30 ENCOUNTER — Ambulatory Visit: Payer: Self-pay | Admitting: Family Medicine

## 2023-12-30 ENCOUNTER — Other Ambulatory Visit: Payer: Self-pay

## 2023-12-30 DIAGNOSIS — E876 Hypokalemia: Secondary | ICD-10-CM

## 2023-12-30 MED ORDER — VITAMIN D (ERGOCALCIFEROL) 1.25 MG (50000 UNIT) PO CAPS: 50000.0000 [IU] | ORAL_CAPSULE | ORAL | 0 refills | Status: AC

## 2024-01-07 ENCOUNTER — Other Ambulatory Visit: Payer: Self-pay | Admitting: Family Medicine

## 2024-02-25 ENCOUNTER — Telehealth: Payer: Self-pay

## 2024-02-25 DIAGNOSIS — E785 Hyperlipidemia, unspecified: Secondary | ICD-10-CM

## 2024-02-25 MED ORDER — ATENOLOL-CHLORTHALIDONE 50-25 MG PO TABS: 1.0000 | ORAL_TABLET | Freq: Every day | ORAL | 1 refills | Status: AC

## 2024-02-25 MED ORDER — ROSUVASTATIN CALCIUM 20 MG PO TABS: ORAL_TABLET | ORAL | 1 refills | Status: AC

## 2024-02-25 MED ORDER — LEVOTHYROXINE SODIUM 25 MCG PO TABS: ORAL_TABLET | ORAL | 1 refills | Status: AC

## 2024-02-25 NOTE — Telephone Encounter (Signed)
 Medication sent to pharmacy    Copied from CRM [id number]. Topic: Clinical - Medication Question >> Feb 25, 2024  3:34 PM Aisha D wrote: Reason for CRM: Pt stated that she wants the rosuvastatin  (CRESTOR ) 20 MG tablet, levothyroxine  (SYNTHROID ) 25 MCG tablet, and atenolol -chlorthalidone  (TENORETIC ) 50-25 MG tablet to start going to the [id number] - [place], [place] - [address].

## 2024-02-28 ENCOUNTER — Other Ambulatory Visit (INDEPENDENT_AMBULATORY_CARE_PROVIDER_SITE_OTHER): Payer: Medicare (Managed Care)

## 2024-02-28 DIAGNOSIS — E876 Hypokalemia: Secondary | ICD-10-CM | POA: Diagnosis not present

## 2024-02-28 LAB — BASIC METABOLIC PANEL WITH GFR
BUN: 10 mg/dL (ref 6–23)
CO2: 34 meq/L — ABNORMAL HIGH (ref 19–32)
Calcium: 9.7 mg/dL (ref 8.4–10.5)
Chloride: 99 meq/L (ref 96–112)
Creatinine, Ser: 0.77 mg/dL (ref 0.40–1.20)
GFR: 76.49 mL/min
Glucose, Bld: 108 mg/dL — ABNORMAL HIGH (ref 70–99)
Potassium: 4 meq/L (ref 3.5–5.1)
Sodium: 141 meq/L (ref 135–145)

## 2024-03-03 ENCOUNTER — Ambulatory Visit: Payer: Self-pay | Admitting: Family Medicine

## 2024-06-25 ENCOUNTER — Ambulatory Visit: Admitting: Family Medicine

## 2024-10-20 ENCOUNTER — Encounter
